# Patient Record
Sex: Female | Born: 1965 | Race: White | Hispanic: No | Marital: Married | Smoking: Former smoker
Health system: Southern US, Community
[De-identification: ages and names within clinical notes are randomized; demographics above are authoritative.]

## PROBLEM LIST (undated history)

## (undated) DIAGNOSIS — E8940 Asymptomatic postprocedural ovarian failure: Secondary | ICD-10-CM

## (undated) DIAGNOSIS — K529 Noninfective gastroenteritis and colitis, unspecified: Secondary | ICD-10-CM

## (undated) DIAGNOSIS — R638 Other symptoms and signs concerning food and fluid intake: Secondary | ICD-10-CM

## (undated) DIAGNOSIS — E785 Hyperlipidemia, unspecified: Secondary | ICD-10-CM

## (undated) DIAGNOSIS — R319 Hematuria, unspecified: Secondary | ICD-10-CM

## (undated) DIAGNOSIS — E669 Obesity, unspecified: Secondary | ICD-10-CM

## (undated) DIAGNOSIS — K5792 Diverticulitis of intestine, part unspecified, without perforation or abscess without bleeding: Secondary | ICD-10-CM

## (undated) DIAGNOSIS — F419 Anxiety disorder, unspecified: Secondary | ICD-10-CM

## (undated) DIAGNOSIS — F32A Depression, unspecified: Secondary | ICD-10-CM

## (undated) DIAGNOSIS — G4730 Sleep apnea, unspecified: Secondary | ICD-10-CM

## (undated) DIAGNOSIS — R300 Dysuria: Secondary | ICD-10-CM

## (undated) HISTORY — PX: HYSTEROSCOPY: SHX211

## (undated) HISTORY — DX: Hematuria, unspecified: R31.9

## (undated) HISTORY — DX: Hyperlipidemia, unspecified: E78.5

## (undated) HISTORY — PX: CHOLECYSTECTOMY: SHX55

## (undated) HISTORY — DX: Anxiety disorder, unspecified: F41.9

## (undated) HISTORY — DX: Dysuria: R30.0

## (undated) HISTORY — DX: Diverticulitis of intestine, part unspecified, without perforation or abscess without bleeding: K57.92

## (undated) HISTORY — DX: Noninfective gastroenteritis and colitis, unspecified: K52.9

## (undated) HISTORY — DX: Obesity, unspecified: E66.9

## (undated) HISTORY — DX: Sleep apnea, unspecified: G47.30

## (undated) HISTORY — PX: SMALL INTESTINE SURGERY: SHX150

## (undated) HISTORY — DX: Depression, unspecified: F32.A

## (undated) HISTORY — PX: COLON SURGERY: SHX602

## (undated) HISTORY — DX: Asymptomatic postprocedural ovarian failure: E89.40

## (undated) HISTORY — DX: Other symptoms and signs concerning food and fluid intake: R63.8

## (undated) HISTORY — PX: DILATION AND CURETTAGE OF UTERUS: SHX78

## (undated) HISTORY — PX: COSMETIC SURGERY: SHX468

## (undated) HISTORY — PX: TUBAL LIGATION: SHX77

## (undated) HISTORY — PX: HYSTEROSCOPY WITH D & C: SHX1775

---

## 1992-05-02 DIAGNOSIS — O24419 Gestational diabetes mellitus in pregnancy, unspecified control: Secondary | ICD-10-CM

## 2002-04-23 HISTORY — PX: HERNIA REPAIR: SHX51

## 2003-04-24 HISTORY — PX: OVARIAN CYST REMOVAL: SHX89

## 2005-02-06 ENCOUNTER — Ambulatory Visit: Payer: Self-pay | Admitting: Obstetrics and Gynecology

## 2005-02-14 ENCOUNTER — Ambulatory Visit: Payer: Self-pay | Admitting: Obstetrics and Gynecology

## 2006-06-27 ENCOUNTER — Ambulatory Visit: Payer: Self-pay | Admitting: Obstetrics and Gynecology

## 2007-05-08 ENCOUNTER — Ambulatory Visit: Payer: Self-pay | Admitting: Family Medicine

## 2007-08-14 ENCOUNTER — Ambulatory Visit: Payer: Self-pay | Admitting: Obstetrics and Gynecology

## 2008-05-04 ENCOUNTER — Ambulatory Visit: Payer: Self-pay | Admitting: Family Medicine

## 2010-09-15 ENCOUNTER — Ambulatory Visit: Payer: Self-pay | Admitting: Internal Medicine

## 2010-10-12 ENCOUNTER — Ambulatory Visit: Payer: Self-pay | Admitting: Obstetrics and Gynecology

## 2010-10-23 ENCOUNTER — Ambulatory Visit: Payer: Self-pay | Admitting: Obstetrics and Gynecology

## 2011-01-02 ENCOUNTER — Ambulatory Visit: Payer: Self-pay | Admitting: Internal Medicine

## 2011-03-12 ENCOUNTER — Ambulatory Visit: Payer: Self-pay | Admitting: Unknown Physician Specialty

## 2012-03-06 ENCOUNTER — Ambulatory Visit: Payer: Self-pay | Admitting: Obstetrics and Gynecology

## 2012-03-06 LAB — CBC
HCT: 41.4 % (ref 35.0–47.0)
HGB: 13.9 g/dL (ref 12.0–16.0)
MCH: 30.2 pg (ref 26.0–34.0)
MCHC: 33.5 g/dL (ref 32.0–36.0)
MCV: 90 fL (ref 80–100)
RDW: 13.2 % (ref 11.5–14.5)

## 2012-03-06 LAB — BASIC METABOLIC PANEL
Anion Gap: 4 — ABNORMAL LOW (ref 7–16)
Calcium, Total: 9 mg/dL (ref 8.5–10.1)
Chloride: 108 mmol/L — ABNORMAL HIGH (ref 98–107)
Co2: 27 mmol/L (ref 21–32)
Osmolality: 276 (ref 275–301)
Potassium: 4.4 mmol/L (ref 3.5–5.1)

## 2012-03-10 ENCOUNTER — Ambulatory Visit: Payer: Self-pay | Admitting: Obstetrics and Gynecology

## 2012-09-29 ENCOUNTER — Other Ambulatory Visit: Payer: Self-pay | Admitting: Unknown Physician Specialty

## 2012-09-29 ENCOUNTER — Ambulatory Visit: Payer: Self-pay | Admitting: Family Medicine

## 2012-09-29 LAB — CLOSTRIDIUM DIFFICILE BY PCR

## 2012-10-03 LAB — STOOL CULTURE

## 2012-10-13 ENCOUNTER — Other Ambulatory Visit: Payer: Self-pay | Admitting: Unknown Physician Specialty

## 2012-10-13 DIAGNOSIS — R933 Abnormal findings on diagnostic imaging of other parts of digestive tract: Secondary | ICD-10-CM

## 2012-10-13 DIAGNOSIS — R112 Nausea with vomiting, unspecified: Secondary | ICD-10-CM

## 2012-10-13 DIAGNOSIS — R197 Diarrhea, unspecified: Secondary | ICD-10-CM

## 2012-10-28 ENCOUNTER — Ambulatory Visit
Admission: RE | Admit: 2012-10-28 | Discharge: 2012-10-28 | Disposition: A | Discharge status: Home / Self Care | Payer: BC Managed Care – PPO | Source: Ambulatory Visit | Attending: Unknown Physician Specialty | Admitting: Unknown Physician Specialty

## 2012-10-28 ENCOUNTER — Other Ambulatory Visit: Payer: Self-pay

## 2012-10-28 DIAGNOSIS — R197 Diarrhea, unspecified: Secondary | ICD-10-CM

## 2012-10-28 DIAGNOSIS — R112 Nausea with vomiting, unspecified: Secondary | ICD-10-CM

## 2012-10-28 DIAGNOSIS — R933 Abnormal findings on diagnostic imaging of other parts of digestive tract: Secondary | ICD-10-CM

## 2012-10-31 ENCOUNTER — Ambulatory Visit
Admission: RE | Admit: 2012-10-31 | Discharge: 2012-10-31 | Disposition: A | Discharge status: Home / Self Care | Payer: BC Managed Care – PPO | Source: Ambulatory Visit | Attending: Unknown Physician Specialty | Admitting: Unknown Physician Specialty

## 2012-10-31 DIAGNOSIS — R197 Diarrhea, unspecified: Secondary | ICD-10-CM

## 2012-10-31 DIAGNOSIS — R112 Nausea with vomiting, unspecified: Secondary | ICD-10-CM

## 2012-10-31 DIAGNOSIS — R933 Abnormal findings on diagnostic imaging of other parts of digestive tract: Secondary | ICD-10-CM

## 2012-10-31 MED ORDER — IOHEXOL 300 MG/ML  SOLN
125.0000 mL | Freq: Once | INTRAMUSCULAR | Status: AC | PRN
  Administered 2012-10-31: 125 mL via INTRAVENOUS

## 2012-11-10 ENCOUNTER — Other Ambulatory Visit: Payer: Self-pay

## 2012-12-15 IMAGING — CT CT ABD-PELV W/ CM
1 of 2 series · 14 of 32 positions shown, 18 images · non-contrast
Comparison: none

REASON FOR EXAM: STAT CR [id number] abdominal and pelvic discomfort
nausea vomiting intermi...
COMMENTS:

[Series 3: soft tissue · axial · 0.77mm/px · z∈[-464,-2]mm · 14 of 175 slices shown, 18 images]
[im 14/175  soft-tissue]
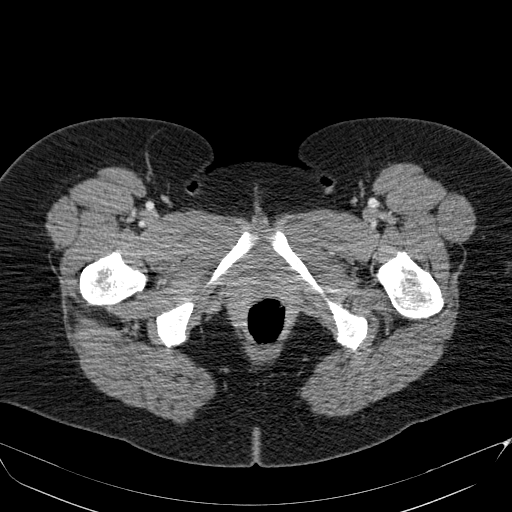
[im 14/175  bone]
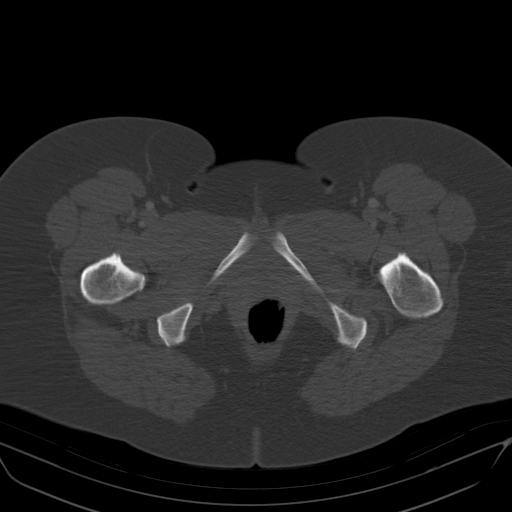
[im 27/175  soft-tissue]
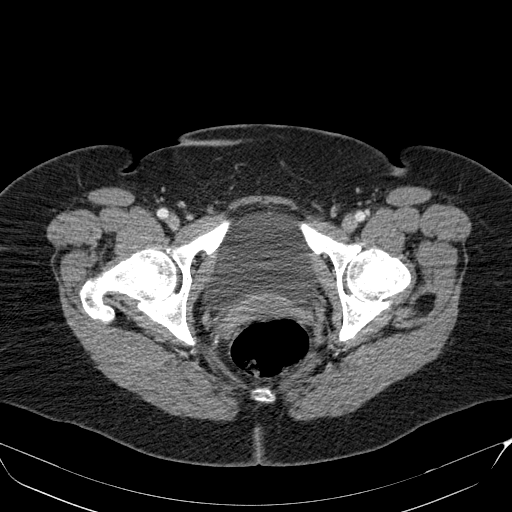
[im 41/175  soft-tissue]
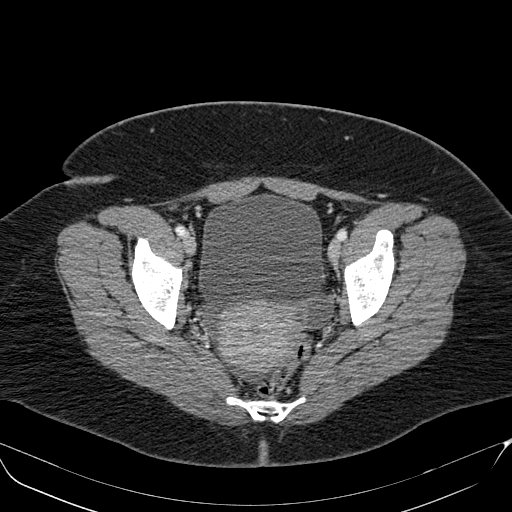
[im 54/175  soft-tissue]
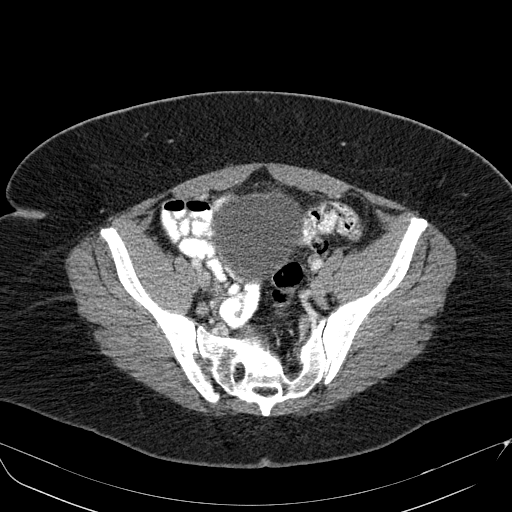
[im 67/175  soft-tissue]
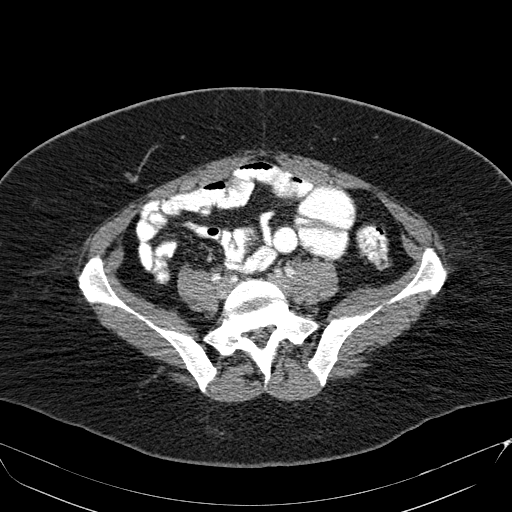
[im 81/175  soft-tissue]
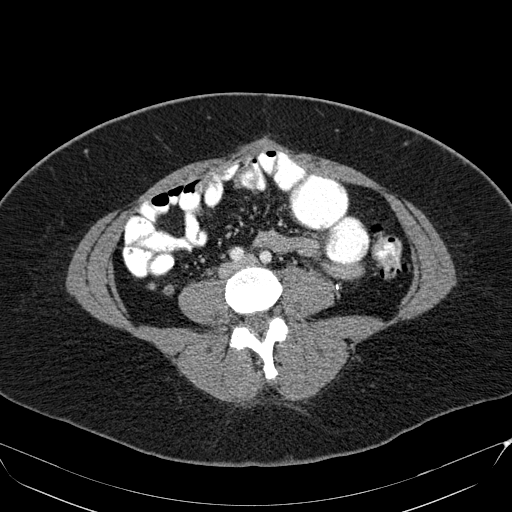
[im 94/175  soft-tissue]
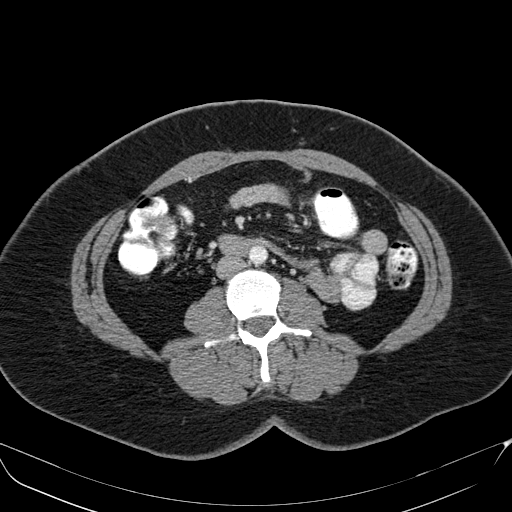
[im 108/175  soft-tissue]
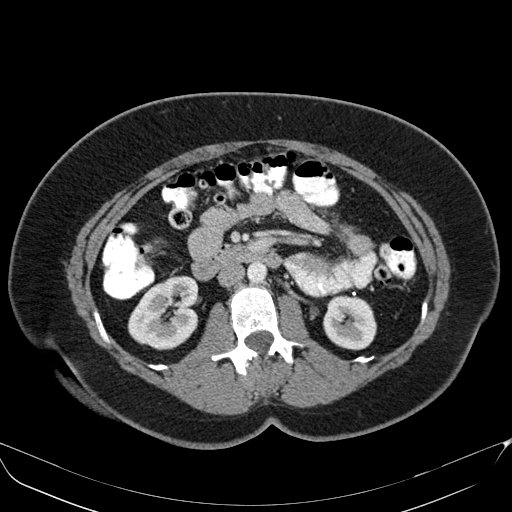
[im 121/175  soft-tissue]
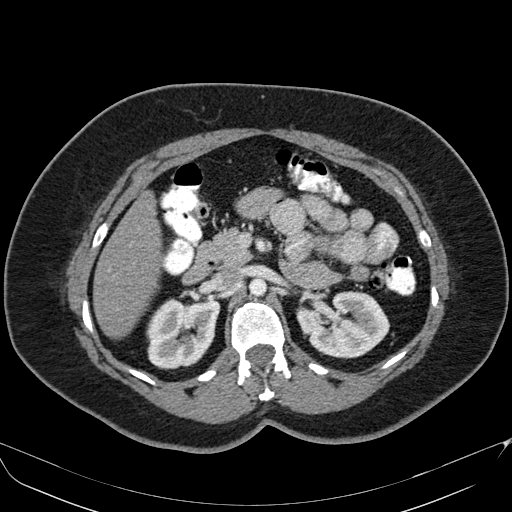
[im 121/175  bone]
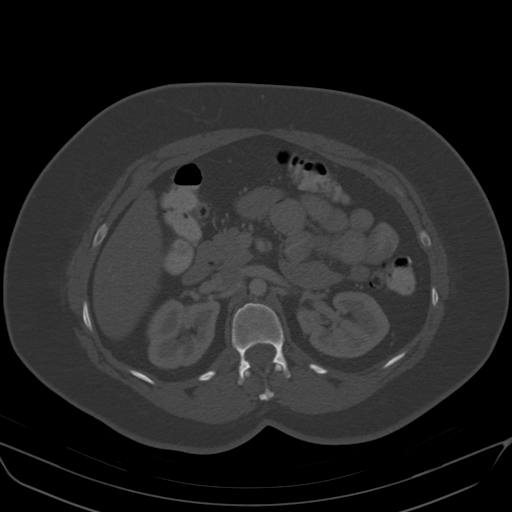
[im 134/175  soft-tissue]
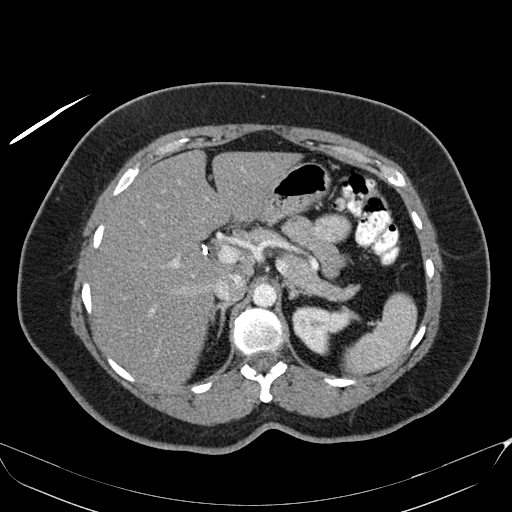
[im 148/175  soft-tissue]
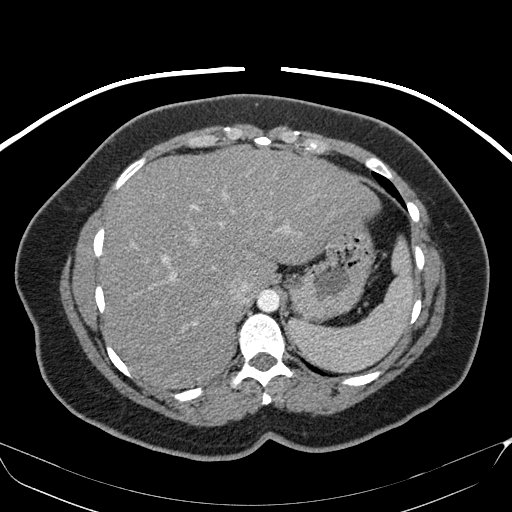
[im 148/175  lung]
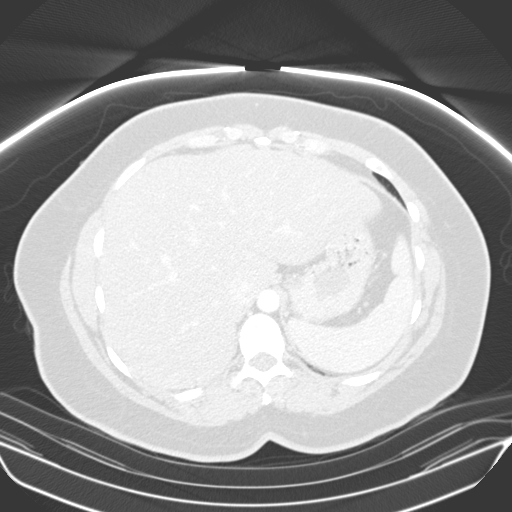
[im 154/175  lung]
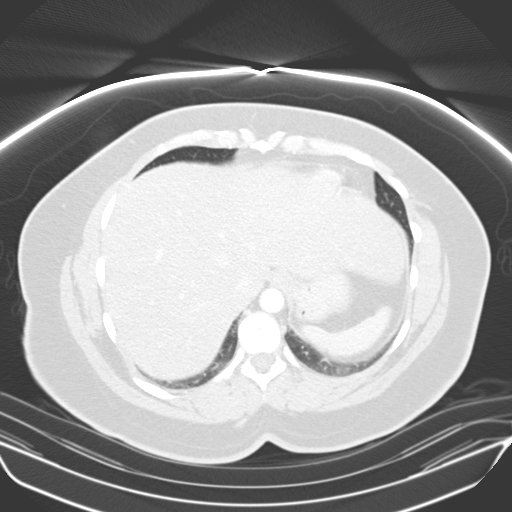
[im 161/175  soft-tissue]
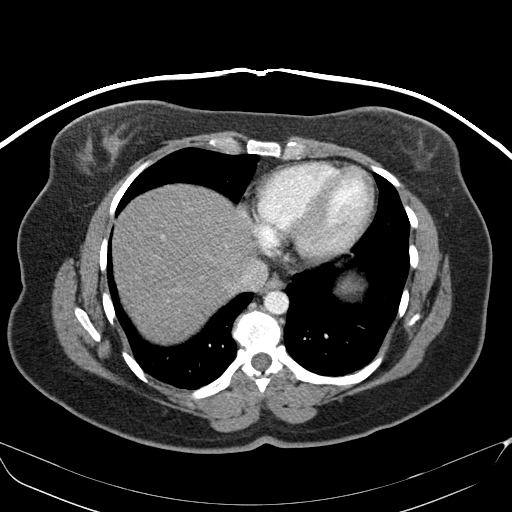
[im 161/175  lung]
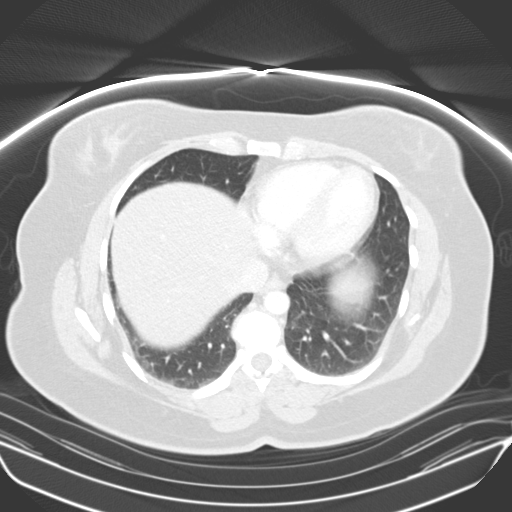
[im 168/175  lung]
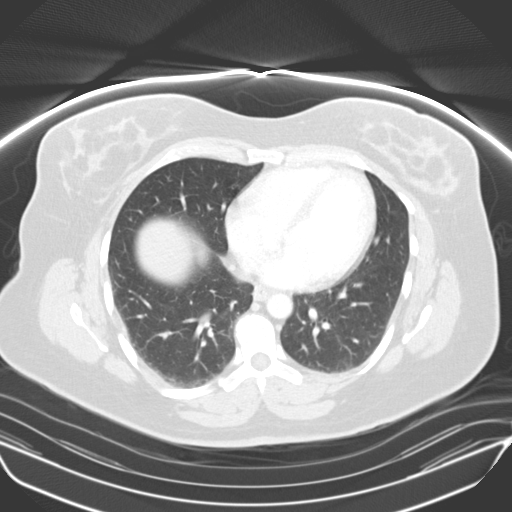

[14 of 32 positions shown; findings below may reference images not displayed]

PROCEDURE:     STAMBERG - STAMBERG ABDOMEN / PELVIS W  - September 15, 2010  [DATE]

RESULT:     Axial CT scanning was performed through the abdomen and pelvis
at 3 mm intervals and slice thicknesses following intravenous administration
of 100 cc of Isovue 370. Review of multiplanar reconstructed images was
performed separately on the VIA monitor.

The liver exhibits decreased density consistent with fatty infiltration.
There is no focal mass or ductal dilation. The gallbladder surgically
absent. The pancreas exhibits no evidence of acute inflammation or masses.
The spleen, partially distended stomach, adrenal glands and kidneys are
normal in appearance. The caliber of the abdominal aorta is normal.

There is a shallow periumbilical hernia against which a loop of the
transverse colon is closely applied. This is not a new finding incarceration
is not seen. There is evidence of previous partial left colectomy. There is
sigmoid diverticulosis without objective evidence of acute diverticulitis. A
normal-appearing appendix is demonstrated. There is no evidence of bowel
obstruction.

Within the pelvis the partially distended urinary bladder is grossly normal.
The uterus is normal in density and contour. It is retroverted. There are
hypodensities in the adnexal regions most conspicuously on the left. This is
seen on image 136 and likely reflects a cystic ovarian process measuring
approximately 3.4 centimeters AP x 2.5 cm transversely. On the right there
is a smaller structure measuring approximately 1.8 cm in diameter. There is
a small amount of free fluid in the pelvis. I see no inguinal hernia.

The lung bases are clear. The lumbar vertebral bodies are preserved in
height.
IMPRESSION: 1. I do not see evidence of acute diverticulitis nor other acute bowel
abnormality.
2. There are hypodensities in the pelvis predominantly on the left which may
reflect cystic ovarian processes. There is a small amount of free fluid
pelvis as well. No acute abnormality of the uterus is seen. Pelvic
ultrasound may be useful for further evaluation.
3. I see no acute urinary tract abnormality nor acute hepatobiliary
abnormality.

This report was called by me to Dr. Miele at [DATE] p.m. on  15 September, 2010.

## 2013-01-29 ENCOUNTER — Ambulatory Visit: Payer: Self-pay

## 2013-02-20 ENCOUNTER — Ambulatory Visit: Payer: Self-pay

## 2013-02-21 HISTORY — PX: VAGINAL HYSTERECTOMY: SUR661

## 2013-03-03 ENCOUNTER — Ambulatory Visit: Payer: Self-pay | Admitting: Obstetrics and Gynecology

## 2013-03-03 LAB — BASIC METABOLIC PANEL
Anion Gap: 2 — ABNORMAL LOW (ref 7–16)
BUN: 16 mg/dL (ref 7–18)
Chloride: 104 mmol/L (ref 98–107)
Creatinine: 0.87 mg/dL (ref 0.60–1.30)
EGFR (African American): >60
EGFR (Non-African Amer.): >60
Glucose: 93 mg/dL (ref 65–99)
Osmolality: 275 (ref 275–301)
Potassium: 4.4 mmol/L (ref 3.5–5.1)
Sodium: 137 mmol/L (ref 136–145)

## 2013-03-03 LAB — CBC
MCH: 30.6 pg (ref 26.0–34.0)
MCV: 90 fL (ref 80–100)
Platelet: 259 10*3/uL (ref 150–440)
RDW: 13.1 % (ref 11.5–14.5)
WBC: 6.7 10*3/uL (ref 3.6–11.0)

## 2013-03-09 ENCOUNTER — Inpatient Hospital Stay: Payer: Self-pay | Admitting: Obstetrics and Gynecology

## 2013-03-09 LAB — CBC WITH DIFFERENTIAL/PLATELET
Eosinophil %: 0 %
HCT: 30.6 % — ABNORMAL LOW (ref 35.0–47.0)
HGB: 10.4 g/dL — ABNORMAL LOW (ref 12.0–16.0)
Lymphocyte #: 0.8 10*3/uL — ABNORMAL LOW (ref 1.0–3.6)
Lymphocyte %: 8.8 %
MCH: 30.9 pg (ref 26.0–34.0)
MCV: 91 fL (ref 80–100)
Monocyte #: 0.3 x10 3/mm (ref 0.2–0.9)
Monocyte %: 3.8 %
Platelet: 251 10*3/uL (ref 150–440)
RBC: 3.37 10*6/uL — ABNORMAL LOW (ref 3.80–5.20)
RDW: 12.9 % (ref 11.5–14.5)
WBC: 9.1 10*3/uL (ref 3.6–11.0)

## 2013-03-10 LAB — CBC WITH DIFFERENTIAL/PLATELET
Basophil #: 0.1 10*3/uL (ref 0.0–0.1)
Basophil %: 0.9 %
Eosinophil #: 0 10*3/uL (ref 0.0–0.7)
Lymphocyte #: 2.8 10*3/uL (ref 1.0–3.6)
MCHC: 34 g/dL (ref 32.0–36.0)
MCV: 90 fL (ref 80–100)
Monocyte #: 1.1 x10 3/mm — ABNORMAL HIGH (ref 0.2–0.9)
Monocyte %: 9.3 %
Neutrophil #: 8 10*3/uL — ABNORMAL HIGH (ref 1.4–6.5)
WBC: 12.1 10*3/uL — ABNORMAL HIGH (ref 3.6–11.0)

## 2013-03-10 LAB — HEMOGLOBIN: HGB: 8.4 g/dL — ABNORMAL LOW (ref 12.0–16.0)

## 2013-03-10 LAB — CREATININE, SERUM
Creatinine: 1.13 mg/dL (ref 0.60–1.30)
EGFR (African American): >60

## 2013-03-11 LAB — CBC WITH DIFFERENTIAL/PLATELET
Basophil #: 0 10*3/uL (ref 0.0–0.1)
Basophil #: 0 10*3/uL (ref 0.0–0.1)
Basophil %: 0.5 %
Eosinophil #: 0.2 10*3/uL (ref 0.0–0.7)
Eosinophil %: 1.6 %
HCT: 18.3 % — ABNORMAL LOW (ref 35.0–47.0)
HCT: 21.6 % — ABNORMAL LOW (ref 35.0–47.0)
HGB: 6.4 g/dL — ABNORMAL LOW (ref 12.0–16.0)
HGB: 7.1 g/dL — ABNORMAL LOW (ref 12.0–16.0)
Lymphocyte #: 3.3 10*3/uL (ref 1.0–3.6)
Lymphocyte %: 37.1 %
MCHC: 34.8 g/dL (ref 32.0–36.0)
MCHC: 33.1 g/dL (ref 32.0–36.0)
MCV: 90 fL (ref 80–100)
Monocyte #: 0.6 x10 3/mm (ref 0.2–0.9)
Monocyte %: 8.2 %
Monocyte %: 6.5 %
Neutrophil #: 4.8 10*3/uL (ref 1.4–6.5)
Platelet: 167 10*3/uL (ref 150–440)
Platelet: 190 10*3/uL (ref 150–440)
RBC: 2.04 10*6/uL — ABNORMAL LOW (ref 3.80–5.20)
RDW: 13.1 % (ref 11.5–14.5)
RDW: 13 % (ref 11.5–14.5)
WBC: 8.9 10*3/uL (ref 3.6–11.0)

## 2013-03-11 LAB — BASIC METABOLIC PANEL
Anion Gap: 3 — ABNORMAL LOW (ref 7–16)
Co2: 31 mmol/L (ref 21–32)
EGFR (Non-African Amer.): >60
Glucose: 96 mg/dL (ref 65–99)
Potassium: 4 mmol/L (ref 3.5–5.1)
Sodium: 141 mmol/L (ref 136–145)

## 2013-03-11 LAB — PATHOLOGY REPORT

## 2013-03-11 LAB — HEMOGLOBIN: HGB: 8.7 g/dL — ABNORMAL LOW (ref 12.0–16.0)

## 2013-03-12 LAB — CBC WITH DIFFERENTIAL/PLATELET
Basophil #: 0 10*3/uL (ref 0.0–0.1)
Eosinophil #: 0.3 10*3/uL (ref 0.0–0.7)
Lymphocyte #: 2.7 10*3/uL (ref 1.0–3.6)
Lymphocyte %: 29.8 %
Monocyte #: 0.6 x10 3/mm (ref 0.2–0.9)
Neutrophil #: 4.3 10*3/uL (ref 1.4–6.5)
Neutrophil %: 59.9 %
RBC: 2.93 10*6/uL — ABNORMAL LOW (ref 3.80–5.20)
RBC: 3.2 10*6/uL — ABNORMAL LOW (ref 3.80–5.20)
WBC: 8.3 10*3/uL (ref 3.6–11.0)
WBC: 8.9 10*3/uL (ref 3.6–11.0)

## 2014-02-09 ENCOUNTER — Ambulatory Visit: Payer: Self-pay | Admitting: Obstetrics and Gynecology

## 2014-02-10 LAB — HM MAMMOGRAPHY

## 2014-08-10 NOTE — Op Note (Signed)
PATIENT NAME:  Joanna Fields, Joanna Fields MR#:  [id number] DATE OF BIRTH:  1965-11-11  DATE OF PROCEDURE:  03/10/2012  PREOPERATIVE DIAGNOSES:  1. Postmenopausal bleeding. 2. Endometrial polyps.  POSTOPERATIVE DIAGNOSES:  1. Postmenopausal bleeding. 2. Endometrial polyps. 3. Uterine prolapse.   SURGICAL PROCEDURES:  1. Hysteroscopy. 2. D and C.   SURGEON: Martin A. DeFrancesco, MD   FIRST ASSISTANT: None.   ANESTHESIA: General.   INDICATIONS: The patient is a 49 year old white female who presents for surgical management of postmenopausal bleeding. The patient had a preoperative ultrasound that demonstrated probable endometrial polyps.   FINDINGS AT SURGERY: Gynecoid bony pelvis. The uterus was normal size in the mid to retroverted position. There was moderate uterine descensus with the cervix coming to within 2 cm of the introitus. The uterus sounded to 9 cm. On hysteroscopy there were a few small endometrial polyps present, otherwise unremarkable endometrial cavity.   DESCRIPTION OF PROCEDURE: The patient was brought to the operating room where she was placed in the supine position. General anesthesia was induced without difficulty. She was placed in the dorsal lithotomy position using the candy-cane stirrups. A Betadine perineal intravaginal prep and drape was performed in the standard fashion. A red Robinson catheter was used to drain 300 mL of urine from the bladder. A weighted speculum was placed in the vagina and a single-tooth tenaculum was placed on the anterior lip of the cervix. The uterus sounded to 9 cm. The endocervical canal was then dilated using Hanks dilators up to a #20 [place] caliber. The [facility] hysteroscope with lactated Ringer's as irrigant was used to assess the intrauterine cavity. The above-noted findings were photodocumented. The endometrium was then curetted with smooth and serrated curettes. Stone polyp forceps were used to extract the residual tissue and polyps. Repeat  hysteroscopy revealed no significant tissue left behind. The procedure was then terminated with all instrumentation being removed from the vagina. The patient was then awakened, mobilized, and taken to the recovery room in satisfactory condition. Estimated blood loss was less than 25 mL. Complications were none. All instruments, needles, and sponge counts were verified as correct. The patient is a good candidate for vaginal hysterectomy if hysterectomy is indicated in the future.   ____________________________ Prentice DockerMartin A. DeFrancesco, MD mad:drc D: 03/10/2012 13:18:12 ET T: 03/10/2012 13:46:06 ET JOB#: [id number]  cc: Daphine DeutscherMartin A. DeFrancesco, MD, <Dictator> [facility] Prentice DockerMARTIN A DEFRANCESCO MD ELECTRONICALLY SIGNED 03/17/2012 12:36

## 2014-08-13 NOTE — Op Note (Signed)
PATIENT NAME:  Joanna Fields, Joanna Fields MR#:  [id number] DATE OF BIRTH:  November 02, 1965  DATE OF PROCEDURE:  03/09/2013  PREOPERATIVE DIAGNOSIS:  Menorrhagia.   POSTOPERATIVE DIAGNOSIS:  Menorrhagia.  OPERATIVE PROCEDURE: TVH, bilateral salpingo-oophorectomy.   SURGEON: Herold HarmsMartin A DeFrancesco, MD   FIRST ASSISTANT: Jacques EarthlyAnika S. Valentino Saxonherry, MD, and Daleen BoSarah Dennin, PA-S.   ANESTHESIA: General endotracheal.   INDICATIONS: The patient is a 49 year old white female with chronic menorrhagia that has been refractory to conservative medical and surgical therapy. She has known endometrial polyps from hysteroscopy, D and C previously. She continues to bleed abnormally and has had pelvic pain in the form of dyspareunia. She is desiring to proceed with definitive surgery.   FINDINGS AT SURGERY: Revealed a top-normal size uterus. There was mild uterine prolapse present. There was a second-degree cystocele present as well. The tubes and ovaries were grossly normal.   DESCRIPTION OF PROCEDURE: The patient was brought to the Operating Room where she was placed in the supine position. General endotracheal anesthesia was induced with difficulty. She was placed in dorsal lithotomy position using the candy-cane stirrups. A Betadine perineal and intravaginal prep and drape was performed in standard fashion. A Foley catheter was placed and was draining clear yellow urine from the bladder.   A weighted speculum was placed into the vagina and a double-tooth tenaculum was placed on the cervix. The posterior colpotomy was made with Mayo scissors. Uterosacral ligaments were clamped, cut and stick tied using 0 Vicryl suture. The cervix was circumscribed using Bovie cautery and the bladder was dissected off the lower uterine segment through sharp and blunt dissection. Sequentially the cardinal and broad ligament complexes were then clamped, cut and stick tied using 0 Vicryl suture. This was carried out to the level of the utero-ovarian ligaments which  were then crossclamped, cut and the specimen was removed from the operative field. The fallopian tubes were then isolated and crossclamped with curved Heaney clamps. These were excised bilaterally. Stick ties were used to place for hemostasis. A moderate amount of oozing was noted to come from each pedicle and the decision was made to remove the ovaries bilaterally. The infundibulopelvic ligament ligaments were clamped and cut using 0 Vicryl suture to ligate the pedicles. Good hemostasis was obtained. This was done bilaterally.   After checking for adequate levels of hemostasis, the vagina was then closed. First the peritoneum was reapproximated using a 0 Vicryl suture in a pursestring stitch. The vaginal mucosa was then closed using simple interrupted sutures of 2-0 chromic. Upon completion of the procedure, all instrumentation was removed from the vagina. The patient was then awakened, mobilized and taken to the recovery room in satisfactory condition.   ESTIMATED BLOOD LOSS: 250 mL.    ____________________________ Prentice DockerMartin A. DeFrancesco, MD mad:cs D: 03/09/2013 19:22:31 ET T: 03/09/2013 19:57:29 ET JOB#: [id number]  cc: Daphine DeutscherMartin A. DeFrancesco, MD, <Dictator> [facility] Prentice DockerMARTIN A DEFRANCESCO MD ELECTRONICALLY SIGNED 03/10/2013 22:04

## 2014-10-01 ENCOUNTER — Telehealth: Payer: Self-pay | Admitting: Gastroenterology

## 2014-10-01 NOTE — Telephone Encounter (Signed)
triage

## 2014-10-12 ENCOUNTER — Other Ambulatory Visit: Payer: Self-pay

## 2014-10-12 NOTE — Telephone Encounter (Signed)
LVM for pt to return my call.

## 2015-02-17 ENCOUNTER — Other Ambulatory Visit: Payer: Self-pay | Admitting: Obstetrics and Gynecology

## 2015-02-17 DIAGNOSIS — Z1231 Encounter for screening mammogram for malignant neoplasm of breast: Secondary | ICD-10-CM

## 2015-02-22 ENCOUNTER — Ambulatory Visit
Admission: RE | Admit: 2015-02-22 | Discharge: 2015-02-22 | Disposition: A | Discharge status: Home / Self Care | Payer: BLUE CROSS/BLUE SHIELD | Source: Ambulatory Visit | Attending: Obstetrics and Gynecology | Admitting: Obstetrics and Gynecology

## 2015-02-22 DIAGNOSIS — Z1231 Encounter for screening mammogram for malignant neoplasm of breast: Secondary | ICD-10-CM | POA: Diagnosis present

## 2015-03-10 ENCOUNTER — Ambulatory Visit (INDEPENDENT_AMBULATORY_CARE_PROVIDER_SITE_OTHER): Payer: BLUE CROSS/BLUE SHIELD | Admitting: Obstetrics and Gynecology

## 2015-03-10 ENCOUNTER — Encounter: Payer: Self-pay | Admitting: Obstetrics and Gynecology

## 2015-03-10 VITALS — BP 121/75 | HR 96 | Ht 69.0 in | Wt 220.3 lb

## 2015-03-10 DIAGNOSIS — R638 Other symptoms and signs concerning food and fluid intake: Secondary | ICD-10-CM | POA: Diagnosis not present

## 2015-03-10 DIAGNOSIS — Z8659 Personal history of other mental and behavioral disorders: Secondary | ICD-10-CM

## 2015-03-10 DIAGNOSIS — E785 Hyperlipidemia, unspecified: Secondary | ICD-10-CM

## 2015-03-10 DIAGNOSIS — N958 Other specified menopausal and perimenopausal disorders: Secondary | ICD-10-CM | POA: Diagnosis not present

## 2015-03-10 DIAGNOSIS — E8940 Asymptomatic postprocedural ovarian failure: Secondary | ICD-10-CM | POA: Insufficient documentation

## 2015-03-10 DIAGNOSIS — K5790 Diverticulosis of intestine, part unspecified, without perforation or abscess without bleeding: Secondary | ICD-10-CM

## 2015-03-10 NOTE — Progress Notes (Signed)
Patient ID: Joanna Fields, female   DOB: 10/10/1965, 49 y.o.   MRN: [id number] 6 month f/u on zoloft and hrt  pt d/c both meds 6 weeks ago- did not help.  started going to gym and losing weight - feels better- lost 27 pounds in 6 months  no meds desired at this time  Chief complaint: 1. Surgical menopause 2. Obesity 3. Anxiety  Patient presents for follow-up issues. She has changed her lifestyle significantly. She is exercising and has had a 27 pound weight loss over the past 6 months. She feels more energized and healthy. She has discontinued her estradiol and her Zoloft. She is experiencing only occasional vasomotor symptoms which are not affecting her quality of life. Anxiety symptoms are not present.  The pros and cons of continued estrogen replacement therapy in a woman who is surgically menopausal were reviewed. Benefits include resolution of vasomotor symptoms, vaginal dryness, and help with prevention of osteoporosis. She is continuing to take calcium with vitamin D and doing weightbearing exercise.  Following discussion and answering of questions regarding ERT, patient has opted to continue with estrogen replacement at this time. All risks are accepted.  IMPRESSION: 1. Surgical menopause, minimally symptomatic 2. Obesity, with 27 pound weight loss through healthy eating and exercise 3. Anxiety, resolved off medication  PLAN: 1. Patient will continue to eat healthy and exercise with goal of losing more weight. 2. She will remain off of Zoloft. 3. Patient will restart estradiol for estrogen replacement therapy benefits.  A total of 25 minutes were spent face-to-face with the patient during this encounter and over half of that time involved counseling and coordination of care.  Herold HarmsMartin A Defrancesco, MD  Note: This dictation was prepared with Dragon dictation along with smaller phrase technology. Any transcriptional errors that result from this process are unintentional.

## 2015-03-10 NOTE — Patient Instructions (Signed)
1. Continue with estrogen replacement therapy for bone health benefits. 2. We discussed the pros and cons of continuing estrogen replacement therapy; vasomotor symptom control, healthy bone contribution, and potential implications for cardiovascular disease development were reviewed. 3. Continue with healthy eating and exercise with ongoing weight loss 4. Return in May 2017 for annual exam

## 2015-09-15 ENCOUNTER — Encounter: Payer: Self-pay | Admitting: Obstetrics and Gynecology

## 2015-10-07 ENCOUNTER — Other Ambulatory Visit: Payer: Self-pay | Admitting: Obstetrics and Gynecology

## 2015-10-12 ENCOUNTER — Encounter: Payer: Self-pay | Admitting: Obstetrics and Gynecology

## 2015-10-12 ENCOUNTER — Ambulatory Visit (INDEPENDENT_AMBULATORY_CARE_PROVIDER_SITE_OTHER): Payer: BLUE CROSS/BLUE SHIELD | Admitting: Obstetrics and Gynecology

## 2015-10-12 VITALS — BP 125/80 | HR 99 | Ht 69.0 in | Wt 204.1 lb

## 2015-10-12 DIAGNOSIS — Z1211 Encounter for screening for malignant neoplasm of colon: Secondary | ICD-10-CM | POA: Diagnosis not present

## 2015-10-12 DIAGNOSIS — R638 Other symptoms and signs concerning food and fluid intake: Secondary | ICD-10-CM | POA: Diagnosis not present

## 2015-10-12 DIAGNOSIS — N958 Other specified menopausal and perimenopausal disorders: Secondary | ICD-10-CM

## 2015-10-12 DIAGNOSIS — Z01419 Encounter for gynecological examination (general) (routine) without abnormal findings: Secondary | ICD-10-CM

## 2015-10-12 DIAGNOSIS — E8940 Asymptomatic postprocedural ovarian failure: Secondary | ICD-10-CM

## 2015-10-12 MED ORDER — ESTRADIOL 0.1 MG/24HR TD PTTW: 1.0000 | MEDICATED_PATCH | TRANSDERMAL | Status: DC

## 2015-10-12 NOTE — Progress Notes (Signed)
Patient ID: Joanna Fields, female   DOB: 20-Dec-1965, 50 y.o.   MRN: [id number] ANNUAL PREVENTATIVE CARE GYN  ENCOUNTER NOTE  Subjective:       Joanna Fields is a 50 y.o. [phone] female here for a routine annual gynecologic exam.  Current complaints: 1.  Surgical Menopause 2. Increased BMI-BMI of 30 but lost over 40 lbs in the past year   Gynecologic History No LMP recorded. Patient is not currently having periods (Reason: Perimenopausal). Contraception: status post hysterectomy Last Pap: 01/2013 neg/neg. Results were: normal Last mammogram: 2016. Results were: normal  Obstetric History OB History  Gravida Para Term Preterm AB SAB TAB Ectopic Multiple Living  # Outcome Date GA Lbr Len/2nd Weight Sex Delivery Anes PTL Lv  2 Term      Vag-Spont   Y  1 Term      Vag-Spont   Y      Past Medical History  Diagnosis Date  . Diverticulitis   . Obesity   . Colitis   . Increased BMI   . Surgical menopause   . Hyperlipemia   . Dysuria   . Hematuria     Past Surgical History  Procedure Laterality Date  . Vaginal hysterectomy  02/2013    tvh bso  . Hysteroscopy w/d&c    . Tubal ligation    . Cosmetic surgery      on face- dog bite  . Hernia repair  2004    ventral  . Colon surgery    . Ovarian cyst removal Left 2005    Current Outpatient Prescriptions on File Prior to Visit  Medication Sig Dispense Refill  . calcium-vitamin D (OSCAL WITH D) 250-125 MG-UNIT tablet Take 1 tablet by mouth daily.    . Multiple Vitamins-Minerals (MULTIVITAMIN WITH MINERALS) tablet Take 1 tablet by mouth daily.    . Omega-3 Fatty Acids (FISH OIL) 1000 MG CAPS Take by mouth.     No current facility-administered medications on file prior to visit.    Allergies  Allergen Reactions  . Penicillins   . Sulfa Antibiotics     Social History   Social History  . Marital Status: Married    Spouse Name: N/A  . Number of Children: N/A  . Years of Education: N/A    Occupational History  . Not on file.   Social History Main Topics  . Smoking status: Former Games developer  . Smokeless tobacco: Not on file  . Alcohol Use: No  . Drug Use: No  . Sexual Activity: Yes    Birth Control/ Protection: Surgical   Other Topics Concern  . Not on file   Social History Narrative    Family History  Problem Relation Age of Onset  . Ovarian cancer Neg Hx   . Colon cancer Neg Hx   . Diabetes Father   . Breast cancer Maternal Aunt     The following portions of the patient's history were reviewed and updated as appropriate: allergies, current medications, past family history, past medical history, past social history, past surgical history and problem list.  Review of Systems ROS Review of Systems - General ROS: negative for - chills, fatigue, fever, hot flashes, night sweats, weight gain or weight loss Psychological ROS: negative for - anxiety, decreased libido, depression, mood swings, physical abuse or sexual abuse Ophthalmic ROS: negative for - blurry vision, eye pain or loss of vision ENT ROS: negative for -  headaches, hearing change, visual changes or vocal changes Allergy and Immunology ROS: negative for - hives, itchy/watery eyes or seasonal allergies Hematological and Lymphatic ROS: negative for - bleeding problems, bruising, swollen lymph nodes or weight loss Endocrine ROS: negative for - galactorrhea, hair pattern changes, hot flashes, malaise/lethargy, mood swings, palpitations, polydipsia/polyuria, skin changes, temperature intolerance or unexpected weight changes Breast ROS: negative for - new or changing breast lumps or nipple discharge Respiratory ROS: negative for - cough or shortness of breath Cardiovascular ROS: negative for - chest pain, irregular heartbeat, palpitations or shortness of breath Gastrointestinal ROS: no abdominal pain, change in bowel habits, or black or bloody stools Genito-Urinary ROS: no dysuria, trouble voiding, or  hematuria Musculoskeletal ROS: negative for - joint pain or joint stiffness Neurological ROS: negative for - bowel and bladder control changes Dermatological ROS: negative for rash and skin lesion changes   Objective:   BP 125/80 mmHg  Pulse 99  Ht 5\' 9"  (1.753 m)  Wt 204 lb 1.6 oz (92.579 kg)  BMI 30.13 kg/m2 CONSTITUTIONAL: Well-developed, well-nourished female in no acute distress.  PSYCHIATRIC: Normal mood and affect. Normal behavior. Normal judgment and thought content. NEUROLGIC: Alert and oriented to person, place, and time. Normal muscle tone coordination. No cranial nerve deficit noted. HENT:  Normocephalic, atraumatic, External right and left ear normal. Oropharynx is clear and moist EYES: Conjunctivae and EOM are normal. No scleral icterus.  NECK: Normal range of motion, supple, no masses.  Normal thyroid.  SKIN: Skin is warm and dry. No rash noted. Not diaphoretic. No erythema. No pallor. CARDIOVASCULAR: Normal heart rate noted, regular rhythm, no murmur. RESPIRATORY: Clear to auscultation bilaterally. Effort and breath sounds normal, no problems with respiration noted. BREASTS: Symmetric in size. No masses, skin changes, nipple drainage, or lymphadenopathy. ABDOMEN: Soft, normal bowel sounds, no distention noted.  No tenderness, rebound or guarding.  BLADDER: Normal PELVIC:  External Genitalia: Normal  BUS: Normal  Vagina: Normal ; good vault support ; good estrogen effect  Cervix: Surgically absent  Uterus: Surgically absent  Adnexa: Normal, nontender  RV: External Exam NormaI, No Rectal Masses and Normal Sphincter tone  MUSCULOSKELETAL: Normal range of motion. No tenderness.  No cyanosis, clubbing, or edema.  2+ distal pulses. LYMPHATIC: No Axillary, Supraclavicular, or Inguinal Adenopathy.    Assessment:   Annual gynecologic examination 50 y.o. Contraception: status post hysterectomy bmi- 30 Problem List Items Addressed This Visit      Other   Surgical  menopause   Increased BMI    Other Visit Diagnoses    Well woman exam with routine gynecological exam    -  Primary    Relevant Orders    Glucose, random    Hemoglobin A1c    Lipid panel    VITAMIN D 25 Hydroxy (Vit-D Deficiency, Fractures)    TSH    Screening for colon cancer        Relevant Orders    Fecal occult blood, imunochemical       Plan:  Pap: Not needed Mammogram: thru employer Stool Guaiac Testing:  Ordered Labs: lipid vit d fbs a1c tsh Routine preventative health maintenance measures emphasized: Exercise/Diet/Weight control, Tobacco Warnings and Alcohol/Substance use risks 1. Continue using estradiol patch 2. Continue with weight loss. Current goal of 180 lbs; advised to lose 20 lbs over the next two years 3. Given stool cards for colon cancer screening 4. Mammogram due this year Return to Clinic - 1 Year  Mable FillPaige Owczarczak, PA-S Sun[employer]ardCrystal Miller, CMA Liberty MutualMartin  A Donald Memoli, MD    I have seen, interviewed, and examined the patient in conjunction with the [facility] [place].A. student and affirm the diagnosis and management plan. Casmira Cramer A. Delight Bickle, MD, FACOG   Note: This dictation was prepared with Dragon dictation along with smaller phrase technology. Any transcriptional errors that result from this process are unintentional.

## 2015-10-12 NOTE — Patient Instructions (Addendum)
1. No Pap smear needed 2. Mammogram to be obtained in October 3. Stool guaiac cards are given for colon cancer screening 4. Continue with healthy eating and exercise with slow steady weight loss. Excellent results to date 5. Calcium with vitamin D supplementation recommended to help minimize osteoporosis risk 6. Return in 1 year 7. Estrogen patch is refilled for 1 year

## 2015-12-22 ENCOUNTER — Telehealth: Payer: Self-pay | Admitting: *Deleted

## 2015-12-22 NOTE — Telephone Encounter (Signed)
Entered in error calling regarding her daughter, Joanna BumpsJessica

## 2016-02-14 ENCOUNTER — Other Ambulatory Visit: Payer: Self-pay | Admitting: Obstetrics and Gynecology

## 2016-02-14 DIAGNOSIS — Z1231 Encounter for screening mammogram for malignant neoplasm of breast: Secondary | ICD-10-CM

## 2016-03-01 ENCOUNTER — Ambulatory Visit
Admission: RE | Admit: 2016-03-01 | Discharge: 2016-03-01 | Disposition: A | Discharge status: Home / Self Care | Payer: BLUE CROSS/BLUE SHIELD | Source: Ambulatory Visit | Attending: Obstetrics and Gynecology | Admitting: Obstetrics and Gynecology

## 2016-03-01 DIAGNOSIS — Z1231 Encounter for screening mammogram for malignant neoplasm of breast: Secondary | ICD-10-CM | POA: Diagnosis present

## 2016-06-17 ENCOUNTER — Other Ambulatory Visit: Payer: Self-pay | Admitting: Obstetrics and Gynecology

## 2016-10-15 NOTE — Progress Notes (Deleted)
Patient ID: Joanna Fields, female   DOB: 1965/05/17, 51 y.o.   MRN: [id number] ANNUAL PREVENTATIVE CARE GYN  ENCOUNTER NOTE  Subjective:       Joanna Fields is a 51 y.o. [phone] female here for a routine annual gynecologic exam.  Current complaints: 1.  Surgical Menopause    Gynecologic History No LMP recorded. Patient is not currently having periods (Reason: Perimenopausal). Contraception: status post hysterectomy Last Pap: 01/2013 neg/neg. Results were: normal Last mammogram: 02/2016 birad 1. Results were: normal  Obstetric History OB History  Gravida Para Term Preterm AB Living  2 2 2     2   SAB TAB Ectopic Multiple Live Births          2    # Outcome Date GA Lbr Len/2nd Weight Sex Delivery Anes PTL Lv  2 Term      Vag-Spont   LIV  1 Term      Vag-Spont   LIV      Past Medical History:  Diagnosis Date  . Colitis   . Diverticulitis   . Dysuria   . Hematuria   . Hyperlipemia   . Increased BMI   . Obesity   . Surgical menopause     Past Surgical History:  Procedure Laterality Date  . COLON SURGERY    . COSMETIC SURGERY     on face- dog bite  . HERNIA REPAIR  2004   ventral  . HYSTEROSCOPY W/D&C    . OVARIAN CYST REMOVAL Left 2005  . TUBAL LIGATION    . VAGINAL HYSTERECTOMY  02/2013   [facility] bso    Current Outpatient Prescriptions on File Prior to Visit  Medication Sig Dispense Refill  . calcium-vitamin D (OSCAL WITH D) 250-125 MG-UNIT tablet Take 1 tablet by mouth daily.    Marland Kitchen estradiol (VIVELLE-DOT) 0.1 MG/24HR patch APPLY 1 PATCH TWICE A WEEK 8 patch 4  . Multiple Vitamins-Minerals (MULTIVITAMIN WITH MINERALS) tablet Take 1 tablet by mouth daily.    . Omega-3 Fatty Acids (FISH OIL) 1000 MG CAPS Take by mouth.     No current facility-administered medications on file prior to visit.     Allergies  Allergen Reactions  . Penicillins   . Sulfa Antibiotics     Social History   Social History  . Marital status: Married    Spouse name: N/A  . Number of  children: N/A  . Years of education: N/A   Occupational History  . Not on file.   Social History Main Topics  . Smoking status: Former Games developer  . Smokeless tobacco: Not on file  . Alcohol use No  . Drug use: No  . Sexual activity: Yes    Birth control/ protection: Surgical   Other Topics Concern  . Not on file   Social History Narrative  . No narrative on file    Family History  Problem Relation Age of Onset  . Diabetes Father   . Breast cancer Maternal Aunt   . Ovarian cancer Neg Hx   . Colon cancer Neg Hx     The following portions of the patient's history were reviewed and updated as appropriate: allergies, current medications, past family history, past medical history, past social history, past surgical history and problem list.  Review of Systems ROS    Objective:   There were no vitals taken for this visit. CONSTITUTIONAL: Well-developed, well-nourished female in no acute distress.  PSYCHIATRIC: Normal mood and affect. Normal behavior. Normal judgment and thought  content. NEUROLGIC: Alert and oriented to person, place, and time. Normal muscle tone coordination. No cranial nerve deficit noted. HENT:  Normocephalic, atraumatic, External right and left ear normal. Oropharynx is clear and moist EYES: Conjunctivae and EOM are normal. No scleral icterus.  NECK: Normal range of motion, supple, no masses.  Normal thyroid.  SKIN: Skin is warm and dry. No rash noted. Not diaphoretic. No erythema. No pallor. CARDIOVASCULAR: Normal heart rate noted, regular rhythm, no murmur. RESPIRATORY: Clear to auscultation bilaterally. Effort and breath sounds normal, no problems with respiration noted. BREASTS: Symmetric in size. No masses, skin changes, nipple drainage, or lymphadenopathy. ABDOMEN: Soft, normal bowel sounds, no distention noted.  No tenderness, rebound or guarding.  BLADDER: Normal PELVIC:  External Genitalia: Normal  BUS: Normal  Vagina: Normal ; good vault  support ; good estrogen effect  Cervix: Surgically absent  Uterus: Surgically absent  Adnexa: Normal, nontender  RV: External Exam NormaI, No Rectal Masses and Normal Sphincter tone  MUSCULOSKELETAL: Normal range of motion. No tenderness.  No cyanosis, clubbing, or edema.  2+ distal pulses. LYMPHATIC: No Axillary, Supraclavicular, or Inguinal Adenopathy.    Assessment:   Annual gynecologic examination 51 y.o. Contraception: status post hysterectomy bmi- 30 Problem List Items Addressed This Visit    Surgical menopause   Increased BMI   History of anxiety   Diverticulosis   Hyperlipidemia    Other Visit Diagnoses    Well woman exam with routine gynecological exam    -  Primary   Screening for colon cancer          Plan:  Pap: Not needed Mammogram: thru employer Stool Guaiac Testing:  Ordered Labs: lipid vit d fbs a1c tsh Routine preventative health maintenance measures emphasized: Exercise/Diet/Weight control, Tobacco Warnings and Alcohol/Substance use risks 1. Continue using estradiol patch 2. Continue with weight loss. Current goal of 180 lbs; advised to lose 20 lbs over the next two years 3. Given stool cards for colon cancer screening 4. Mammogram due this year Return to Clinic - 1 Year   Fran Mcree [place], CMA    I have seen, interviewed, and examined the patient in conjunction with the [employer] student and affirm the diagnosis and management plan. Martin A. DeFrancesco, MD, FACOG   Note: This dictation was prepared with Dragon dictation along with smaller phrase technology. Any transcriptional errors that result from this process are unintentional.

## 2016-10-17 ENCOUNTER — Encounter: Payer: BLUE CROSS/BLUE SHIELD | Admitting: Obstetrics and Gynecology

## 2016-11-05 NOTE — Progress Notes (Deleted)
Patient ID: Joanna Fields, female   DOB: 1965/05/17, 51 y.o.   MRN: [id number] ANNUAL PREVENTATIVE CARE GYN  ENCOUNTER NOTE  Subjective:       Joanna Fields is a 51 y.o. [phone] female here for a routine annual gynecologic exam.  Current complaints: 1.  Surgical Menopause    Gynecologic History No LMP recorded. Patient is not currently having periods (Reason: Perimenopausal). Contraception: status post hysterectomy Last Pap: 01/2013 neg/neg. Results were: normal Last mammogram: 02/2016 birad 1. Results were: normal  Obstetric History OB History  Gravida Para Term Preterm AB Living  2 2 2     2   SAB TAB Ectopic Multiple Live Births          2    # Outcome Date GA Lbr Len/2nd Weight Sex Delivery Anes PTL Lv  2 Term      Vag-Spont   LIV  1 Term      Vag-Spont   LIV      Past Medical History:  Diagnosis Date  . Colitis   . Diverticulitis   . Dysuria   . Hematuria   . Hyperlipemia   . Increased BMI   . Obesity   . Surgical menopause     Past Surgical History:  Procedure Laterality Date  . COLON SURGERY    . COSMETIC SURGERY     on face- dog bite  . HERNIA REPAIR  2004   ventral  . HYSTEROSCOPY W/D&C    . OVARIAN CYST REMOVAL Left 2005  . TUBAL LIGATION    . VAGINAL HYSTERECTOMY  02/2013   [facility]    Current Outpatient Prescriptions on File Prior to Visit  Medication Sig Dispense Refill  . calcium-vitamin D (OSCAL WITH D) 250-125 MG-UNIT tablet Take 1 tablet by mouth daily.    Marland Kitchen estradiol (VIVELLE-DOT) 0.1 MG/24HR patch APPLY 1 PATCH TWICE A WEEK 8 patch 4  . Multiple Vitamins-Minerals (MULTIVITAMIN WITH MINERALS) tablet Take 1 tablet by mouth daily.    . Omega-3 Fatty Acids (FISH OIL) 1000 MG CAPS Take by mouth.     No current facility-administered medications on file prior to visit.     Allergies  Allergen Reactions  . Penicillins   . Sulfa Antibiotics     Social History   Social History  . Marital status: Married    Spouse name: N/A  . Number of  children: N/A  . Years of education: N/A   Occupational History  . Not on file.   Social History Main Topics  . Smoking status: Former Games developer  . Smokeless tobacco: Not on file  . Alcohol use No  . Drug use: No  . Sexual activity: Yes    Birth control/ protection: Surgical   Other Topics Concern  . Not on file   Social History Narrative  . No narrative on file    Family History  Problem Relation Age of Onset  . Diabetes Father   . Breast cancer Maternal Aunt   . Ovarian cancer Neg Hx   . Colon cancer Neg Hx     The following portions of the patient's history were reviewed and updated as appropriate: allergies, current medications, past family history, past medical history, past social history, past surgical history and problem list.  Review of Systems ROS    Objective:   There were no vitals taken for this visit. CONSTITUTIONAL: Well-developed, well-nourished female in no acute distress.  PSYCHIATRIC: Normal mood and affect. Normal behavior. Normal judgment and thought  content. NEUROLGIC: Alert and oriented to person, place, and time. Normal muscle tone coordination. No cranial nerve deficit noted. HENT:  Normocephalic, atraumatic, External right and left ear normal. Oropharynx is clear and moist EYES: Conjunctivae and EOM are normal. No scleral icterus.  NECK: Normal range of motion, supple, no masses.  Normal thyroid.  SKIN: Skin is warm and dry. No rash noted. Not diaphoretic. No erythema. No pallor. CARDIOVASCULAR: Normal heart rate noted, regular rhythm, no murmur. RESPIRATORY: Clear to auscultation bilaterally. Effort and breath sounds normal, no problems with respiration noted. BREASTS: Symmetric in size. No masses, skin changes, nipple drainage, or lymphadenopathy. ABDOMEN: Soft, normal bowel sounds, no distention noted.  No tenderness, rebound or guarding.  BLADDER: Normal PELVIC:  External Genitalia: Normal  BUS: Normal  Vagina: Normal ; good vault  support ; good estrogen effect  Cervix: Surgically absent  Uterus: Surgically absent  Adnexa: Normal, nontender  RV: External Exam NormaI, No Rectal Masses and Normal Sphincter tone  MUSCULOSKELETAL: Normal range of motion. No tenderness.  No cyanosis, clubbing, or edema.  2+ distal pulses. LYMPHATIC: No Axillary, Supraclavicular, or Inguinal Adenopathy.    Assessment:   Annual gynecologic examination 51 y.o. Contraception: status post hysterectomy bmi- 30 Problem List Items Addressed This Visit    Surgical menopause   Increased BMI   History of anxiety   Diverticulosis   Hyperlipidemia    Other Visit Diagnoses    Well woman exam with routine gynecological exam    -  Primary   Screening for colon cancer          Plan:  Pap: Not needed Mammogram: thru employer Stool Guaiac Testing:  Ordered Labs: lipid vit d fbs a1c tsh Routine preventative health maintenance measures emphasized: Exercise/Diet/Weight control, Tobacco Warnings and Alcohol/Substance use risks 1. Continue using estradiol patch 2. Continue with weight loss. Current goal of 180 lbs; advised to lose 20 lbs over the next two years 3. Given stool cards for colon cancer screening 4. Mammogram due this year Return to Clinic - 1 Year   Joanna Fields, CMA    I have seen, interviewed, and examined the patient in conjunction with the [employer] student and affirm the diagnosis and management plan. Joanna A. DeFrancesco, MD, FACOG   Note: This dictation was prepared with Dragon dictation along with smaller phrase technology. Any transcriptional errors that result from this process are unintentional.

## 2016-11-07 ENCOUNTER — Encounter: Payer: BLUE CROSS/BLUE SHIELD | Admitting: Obstetrics and Gynecology

## 2016-12-10 ENCOUNTER — Other Ambulatory Visit: Payer: Self-pay | Admitting: Obstetrics and Gynecology

## 2016-12-18 ENCOUNTER — Encounter: Payer: BLUE CROSS/BLUE SHIELD | Admitting: Obstetrics and Gynecology

## 2016-12-21 NOTE — Progress Notes (Deleted)
Patient ID: Joanna Fields, female   DOB: [birth year], 51 y.o.   MRN: [id number] ANNUAL PREVENTATIVE CARE GYN  ENCOUNTER NOTE  Subjective:       Joanna Fields is a 51 y.o. [phone] female here for a routine annual gynecologic exam.  Current complaints: 1.  Surgical Menopause    Gynecologic History No LMP recorded. Patient is not currently having periods (Reason: Perimenopausal). Contraception: status post hysterectomy Last Pap: 01/2013 neg/neg. Results were: normal Last mammogram: 11/2017birad 1. Results were: normal  Obstetric History OB History  Gravida Para Term Preterm AB Living  2 2 2     2   SAB TAB Ectopic Multiple Live Births          2    # Outcome Date GA Lbr Len/2nd Weight Sex Delivery Anes PTL Lv  2 Term      Vag-Spont   LIV  1 Term      Vag-Spont   LIV      Past Medical History:  Diagnosis Date  . Colitis   . Diverticulitis   . Dysuria   . Hematuria   . Hyperlipemia   . Increased BMI   . Obesity   . Surgical menopause     Past Surgical History:  Procedure Laterality Date  . COLON SURGERY    . COSMETIC SURGERY     on face- dog bite  . HERNIA REPAIR  2004   ventral  . HYSTEROSCOPY W/D&C    . OVARIAN CYST REMOVAL Left 2005  . TUBAL LIGATION    . VAGINAL HYSTERECTOMY  02/2013   [facility] bso    Current Outpatient Prescriptions on File Prior to Visit  Medication Sig Dispense Refill  . calcium-vitamin D (OSCAL WITH D) 250-125 MG-UNIT tablet Take 1 tablet by mouth daily.    Marland Kitchen estradiol (VIVELLE-DOT) 0.1 MG/24HR patch APPLY 1 PATCH TWICE A WEEK 8 patch 0  . Multiple Vitamins-Minerals (MULTIVITAMIN WITH MINERALS) tablet Take 1 tablet by mouth daily.    . Omega-3 Fatty Acids (FISH OIL) 1000 MG CAPS Take by mouth.     No current facility-administered medications on file prior to visit.     Allergies  Allergen Reactions  . Penicillins   . Sulfa Antibiotics     Social History   Social History  . Marital status: Married    Spouse name: N/A  . Number of  children: N/A  . Years of education: N/A   Occupational History  . Not on file.   Social History Main Topics  . Smoking status: Former Games developer  . Smokeless tobacco: Not on file  . Alcohol use No  . Drug use: No  . Sexual activity: Yes    Birth control/ protection: Surgical   Other Topics Concern  . Not on file   Social History Narrative  . No narrative on file    Family History  Problem Relation Age of Onset  . Diabetes Father   . Breast cancer Maternal Aunt   . Ovarian cancer Neg Hx   . Colon cancer Neg Hx     The following portions of the patient's history were reviewed and updated as appropriate: allergies, current medications, past family history, past medical history, past social history, past surgical history and problem list.  Review of Systems ROS  Objective:   There were no vitals taken for this visit. CONSTITUTIONAL: Well-developed, well-nourished female in no acute distress.  PSYCHIATRIC: Normal mood and affect. Normal behavior. Normal judgment and thought content. NEUROLGIC: Alert  and oriented to person, place, and time. Normal muscle tone coordination. No cranial nerve deficit noted. HENT:  Normocephalic, atraumatic, External right and left ear normal. Oropharynx is clear and moist EYES: Conjunctivae and EOM are normal. No scleral icterus.  NECK: Normal range of motion, supple, no masses.  Normal thyroid.  SKIN: Skin is warm and dry. No rash noted. Not diaphoretic. No erythema. No pallor. CARDIOVASCULAR: Normal heart rate noted, regular rhythm, no murmur. RESPIRATORY: Clear to auscultation bilaterally. Effort and breath sounds normal, no problems with respiration noted. BREASTS: Symmetric in size. No masses, skin changes, nipple drainage, or lymphadenopathy. ABDOMEN: Soft, normal bowel sounds, no distention noted.  No tenderness, rebound or guarding.  BLADDER: Normal PELVIC:  External Genitalia: Normal  BUS: Normal  Vagina: Normal ; good vault support ;  good estrogen effect  Cervix: Surgically absent  Uterus: Surgically absent  Adnexa: Normal, nontender  RV: External Exam NormaI, No Rectal Masses and Normal Sphincter tone  MUSCULOSKELETAL: Normal range of motion. No tenderness.  No cyanosis, clubbing, or edema.  2+ distal pulses. LYMPHATIC: No Axillary, Supraclavicular, or Inguinal Adenopathy.    Assessment:   Annual gynecologic examination 51 y.o. Contraception: status post hysterectomy bmi- 30 Problem List Items Addressed This Visit    Surgical menopause   Increased BMI   History of anxiety   Hyperlipidemia    Other Visit Diagnoses    Well woman exam with routine gynecological exam    -  Primary   Screening for colon cancer          Plan:  Pap: Not needed Mammogram: thru employer Stool Guaiac Testing:  Ordered Labs: lipid vit d fbs a1c tsh Routine preventative health maintenance measures emphasized: Exercise/Diet/Weight control, Tobacco Warnings and Alcohol/Substance use risks 1. Continue using estradiol patch 2. Continue with weight loss. Current goal of 180 lbs; advised to lose 20 lbs over the next two years 3. Given stool cards for colon cancer screening 4. Mammogram due this year Return to Clinic - 1 Year      I have seen, interviewed, and examined the patient in conjunction with the [employer] [facility] and affirm the diagnosis and management plan. Martin A. DeFrancesco, MD, FACOG   Note: This dictation was prepared with Dragon dictation along with smaller phrase technology. Any transcriptional errors that result from this process are unintentional.

## 2016-12-27 ENCOUNTER — Encounter: Payer: BLUE CROSS/BLUE SHIELD | Admitting: Obstetrics and Gynecology

## 2017-01-17 NOTE — Progress Notes (Deleted)
Patient ID: Joanna Fields, female   DOB: 1966/02/05, 51 y.o.   MRN: [id number] ANNUAL PREVENTATIVE CARE GYN  ENCOUNTER NOTE  Subjective:       Joanna Fields is a 51 y.o. [phone] female here for a routine annual gynecologic exam.  Current complaints: 1.  Surgical Menopause    Gynecologic History No LMP recorded. Patient is not currently having periods (Reason: Perimenopausal). Contraception: status post hysterectomy Last Pap:2014 neg/neg. Results were: normal Last mammogram: 11/2017birad 1. Results were: normal  Obstetric History OB History  Gravida Para Term Preterm AB Living  SAB TAB Ectopic Multiple Live Births          2    # Outcome Date GA Lbr Len/2nd Weight Sex Delivery Anes PTL Lv  2 Term      Vag-Spont   LIV  1 Term      Vag-Spont   LIV      Past Medical History:  Diagnosis Date  . Colitis   . Diverticulitis   . Dysuria   . Hematuria   . Hyperlipemia   . Increased BMI   . Obesity   . Surgical menopause     Past Surgical History:  Procedure Laterality Date  . COLON SURGERY    . COSMETIC SURGERY     on face- dog bite  . HERNIA REPAIR  2004   ventral  . HYSTEROSCOPY W/D&C    . OVARIAN CYST REMOVAL Left 2005  . TUBAL LIGATION    . VAGINAL HYSTERECTOMY  02/2013   [facility] bso    Current Outpatient Prescriptions on File Prior to Visit  Medication Sig Dispense Refill  . calcium-vitamin D (OSCAL WITH D) 250-125 MG-UNIT tablet Take 1 tablet by mouth daily.    Marland Kitchen estradiol (VIVELLE-DOT) 0.1 MG/24HR patch APPLY 1 PATCH TWICE A WEEK 8 patch 0  . Multiple Vitamins-Minerals (MULTIVITAMIN WITH MINERALS) tablet Take 1 tablet by mouth daily.    . Omega-3 Fatty Acids (FISH OIL) 1000 MG CAPS Take by mouth.     No current facility-administered medications on file prior to visit.     Allergies  Allergen Reactions  . Penicillins   . Sulfa Antibiotics     Social History   Social History  . Marital status: Married    Spouse name: N/A  . Number of  children: N/A  . Years of education: N/A   Occupational History  . Not on file.   Social History Main Topics  . Smoking status: Former Games developer  . Smokeless tobacco: Not on file  . Alcohol use No  . Drug use: No  . Sexual activity: Yes    Birth control/ protection: Surgical   Other Topics Concern  . Not on file   Social History Narrative  . No narrative on file    Family History  Problem Relation Age of Onset  . Diabetes Father   . Breast cancer Maternal Aunt   . Ovarian cancer Neg Hx   . Colon cancer Neg Hx     The following portions of the patient's history were reviewed and updated as appropriate: allergies, current medications, past family history, past medical history, past social history, past surgical history and problem list.  Review of Systems ROS  Objective:   There were no vitals taken for this visit. CONSTITUTIONAL: Well-developed, well-nourished female in no acute distress.  PSYCHIATRIC: Normal mood and affect. Normal behavior. Normal judgment and thought content. NEUROLGIC: Alert and  oriented to person, place, and time. Normal muscle tone coordination. No cranial nerve deficit noted. HENT:  Normocephalic, atraumatic, External right and left ear normal. Oropharynx is clear and moist EYES: Conjunctivae and EOM are normal. No scleral icterus.  NECK: Normal range of motion, supple, no masses.  Normal thyroid.  SKIN: Skin is warm and dry. No rash noted. Not diaphoretic. No erythema. No pallor. CARDIOVASCULAR: Normal heart rate noted, regular rhythm, no murmur. RESPIRATORY: Clear to auscultation bilaterally. Effort and breath sounds normal, no problems with respiration noted. BREASTS: Symmetric in size. No masses, skin changes, nipple drainage, or lymphadenopathy. ABDOMEN: Soft, normal bowel sounds, no distention noted.  No tenderness, rebound or guarding.  BLADDER: Normal PELVIC:  External Genitalia: Normal  BUS: Normal  Vagina: Normal ; good vault support ;  good estrogen effect  Cervix: Surgically absent  Uterus: Surgically absent  Adnexa: Normal, nontender  RV: External Exam NormaI, No Rectal Masses and Normal Sphincter tone  MUSCULOSKELETAL: Normal range of motion. No tenderness.  No cyanosis, clubbing, or edema.  2+ distal pulses. LYMPHATIC: No Axillary, Supraclavicular, or Inguinal Adenopathy.    Assessment:   Annual gynecologic examination 51 y.o. Contraception: status post hysterectomy bmi- 30 Problem List Items Addressed This Visit    Surgical menopause   Increased BMI   History of anxiety   Diverticulosis   Hyperlipidemia    Other Visit Diagnoses    Well woman exam with routine gynecological exam    -  Primary   Screening for colon cancer          Plan:  Pap: Not needed Mammogram: thru employer Stool Guaiac Testing:  Ordered Labs: lipid vit d fbs a1c tsh Routine preventative health maintenance measures emphasized: Exercise/Diet/Weight control, Tobacco Warnings and Alcohol/Substance use risks 1. Continue using estradiol patch 2. Continue with weight loss. Current goal of 180 lbs; advised to lose 20 lbs over the next two years 3. Given stool cards for colon cancer screening 4. Mammogram due this year Return to Clinic - 1 Year      I have seen, interviewed, and examined the patient in conjunction with the [facility] [employer] student and affirm the diagnosis and management plan. Martin A. DeFrancesco, MD, FACOG   Note: This dictation was prepared with Dragon dictation along with smaller phrase technology. Any transcriptional errors that result from this process are unintentional.

## 2017-01-22 ENCOUNTER — Other Ambulatory Visit: Payer: Self-pay | Admitting: Obstetrics and Gynecology

## 2017-01-24 ENCOUNTER — Encounter: Payer: BLUE CROSS/BLUE SHIELD | Admitting: Obstetrics and Gynecology

## 2017-02-08 ENCOUNTER — Encounter: Payer: BLUE CROSS/BLUE SHIELD | Admitting: Obstetrics and Gynecology

## 2017-02-13 ENCOUNTER — Encounter: Payer: BLUE CROSS/BLUE SHIELD | Admitting: Obstetrics and Gynecology

## 2017-02-16 ENCOUNTER — Other Ambulatory Visit: Payer: Self-pay | Admitting: Obstetrics and Gynecology

## 2017-02-25 ENCOUNTER — Other Ambulatory Visit: Payer: Self-pay | Admitting: Obstetrics and Gynecology

## 2017-02-25 DIAGNOSIS — Z1231 Encounter for screening mammogram for malignant neoplasm of breast: Secondary | ICD-10-CM

## 2017-03-01 NOTE — Progress Notes (Signed)
Patient ID: Joanna Fields, female   DOB: 06/04/1965, 51 y.o.   MRN: [id number] ANNUAL PREVENTATIVE CARE GYN  ENCOUNTER NOTE  Subjective:       Joanna Fields is a 51 y.o. [phone] female here for a routine annual gynecologic exam.  Current complaints: 1.  Surgical Menopause; vasomotor symptoms are reasonably controlled with the Vivelle-Dot patch 0.1 mg twice weekly; she still gets an occasional night sweat.  She is in here for of her ERT therapy; we will discuss continuing therapy after next years visit.  Bowel and bladder function are normal.  Daughter was diagnosed this year with stage III melanoma on her abdomen; stressors regarding her diagnosis and treatment are stabilized.    Gynecologic History No LMP recorded. Patient is not currently having periods (Reason: Perimenopausal). Contraception: status post hysterectomy Last Pap: 01/2013 neg/neg. Results were: normal Last mammogram: 03/01/2016 birad 1. Results were: normal  Obstetric History OB History  Gravida Para Term Preterm AB Living  2 2 2     2   SAB TAB Ectopic Multiple Live Births          2    # Outcome Date GA Lbr Len/2nd Weight Sex Delivery Anes PTL Lv  2 Term      Vag-Spont   LIV  1 Term      Vag-Spont   LIV      Past Medical History:  Diagnosis Date  . Colitis   . Diverticulitis   . Dysuria   . Hematuria   . Hyperlipemia   . Increased BMI   . Obesity   . Surgical menopause     Past Surgical History:  Procedure Laterality Date  . COLON SURGERY    . COSMETIC SURGERY     on face- dog bite  . HERNIA REPAIR  2004   ventral  . HYSTEROSCOPY W/D&C    . OVARIAN CYST REMOVAL Left 2005  . TUBAL LIGATION    . VAGINAL HYSTERECTOMY  02/2013   [facility] bso    Current Outpatient Medications on File Prior to Visit  Medication Sig Dispense Refill  . calcium-vitamin D (OSCAL WITH D) 250-125 MG-UNIT tablet Take 1 tablet by mouth daily.    Marland Kitchen. estradiol (VIVELLE-DOT) 0.1 MG/24HR patch APPLY 1 PATCH TWICE A WEEK 8 patch 0   . Multiple Vitamins-Minerals (MULTIVITAMIN WITH MINERALS) tablet Take 1 tablet by mouth daily.    . Omega-3 Fatty Acids (FISH OIL) 1000 MG CAPS Take by mouth.     No current facility-administered medications on file prior to visit.     Allergies  Allergen Reactions  . Penicillins   . Sulfa Antibiotics     Social History   Socioeconomic History  . Marital status: Married    Spouse name: Not on file  . Number of children: Not on file  . Years of education: Not on file  . Highest education level: Not on file  Social Needs  . Financial resource strain: Not on file  . Food insecurity - worry: Not on file  . Food insecurity - inability: Not on file  . Transportation needs - medical: Not on file  . Transportation needs - non-medical: Not on file  Occupational History  . Not on file  Tobacco Use  . Smoking status: Former Smoker  Substance and Sexual Activity  . Alcohol use: No  . Drug use: No  . Sexual activity: Yes    Birth control/protection: Surgical  Other Topics Concern  . Not on file  Social History Narrative  . Not on file    Family History  Problem Relation Age of Onset  . Diabetes Father   . Breast cancer Maternal Aunt   . Ovarian cancer Neg Hx   . Colon cancer Neg Hx     The following portions of the patient's history were reviewed and updated as appropriate: allergies, current medications, past family history, past medical history, past social history, past surgical history and problem list.  Review of Systems Review of Systems  Constitutional:       Vasomotor symptoms controlled with ERT  HENT: Negative.   Eyes: Negative.   Respiratory: Negative.   Cardiovascular: Negative.   Gastrointestinal: Negative.   Genitourinary: Negative.   Musculoskeletal: Negative.   Skin: Negative.   Neurological: Negative.   Endo/Heme/Allergies: Negative.   Psychiatric/Behavioral: Negative.     Objective:   BP 127/73   Pulse 97   Ht 5\' 9"  (1.753 m)   Wt 222 lb  3.2 oz (100.8 kg)   BMI 32.81 kg/m  CONSTITUTIONAL: Well-developed, well-nourished female in no acute distress.  PSYCHIATRIC: Normal mood and affect. Normal behavior. Normal judgment and thought content. NEUROLGIC: Alert and oriented to person, place, and time. Normal muscle tone coordination. No cranial nerve deficit noted. HENT:  Normocephalic, atraumatic, External right and left ear normal. Oropharynx is clear and moist EYES: Conjunctivae and EOM are normal. No scleral icterus.  NECK: Normal range of motion, supple, no masses.  Normal thyroid.  SKIN: Skin is warm and dry. No rash noted. Not diaphoretic. No erythema. No pallor. CARDIOVASCULAR: Normal heart rate noted, regular rhythm, no murmur. RESPIRATORY: Clear to auscultation bilaterally. Effort and breath sounds normal, no problems with respiration noted. BREASTS: Symmetric in size. No masses, skin changes, nipple drainage, or lymphadenopathy. ABDOMEN: Soft, normal bowel sounds, no distention noted.  No tenderness, rebound or guarding.  BLADDER: Normal PELVIC: (Physical exam unchanged)  External Genitalia: Normal  BUS: Normal  Vagina: Normal ; good vault support ; good estrogen effect  Cervix: Surgically absent  Uterus: Surgically absent  Adnexa: Normal, nontender  RV: External Exam NormaI, No Rectal Masses and Normal Sphincter tone  MUSCULOSKELETAL: Normal range of motion. No tenderness.  No cyanosis, clubbing, or edema.  2+ distal pulses. LYMPHATIC: No Axillary, Supraclavicular, or Inguinal Adenopathy.    Assessment:   Annual gynecologic examination 51 y.o. Contraception: status post hysterectomy [id number] Problem List Items Addressed This Visit    Surgical menopause   Increased BMI   History of anxiety    Other Visit Diagnoses    Well woman exam with routine gynecological exam    -  Primary      Plan:  Pap: Not needed Mammogram: thru employer- scheduled for 03/06/2017 Stool Guaiac Testing:  Ordered Labs: lipid vit  d fbs a1c tsh Routine preventative health maintenance measures emphasized: Exercise/Diet/Weight control, Tobacco Warnings and Alcohol/Substance use risks 1. Continue using estradiol patch-Vivelle-Dot 0.1 mg twice weekly 2. Continue with healthy eating and exercise and control weight loss with a goal of 180 pounds 3. Given stool cards for colon cancer screening 4. Mammogram due this year [address]  Crystal GreenvilleMiller, [place] Herold HarmsMartin A Aniqa Hare, MD  Note: This dictation was prepared with Dragon dictation along with smaller phrase technology. Any transcriptional errors that result from this process are unintentional.

## 2017-03-05 ENCOUNTER — Encounter: Payer: Self-pay | Admitting: Obstetrics and Gynecology

## 2017-03-05 ENCOUNTER — Ambulatory Visit (INDEPENDENT_AMBULATORY_CARE_PROVIDER_SITE_OTHER): Payer: BLUE CROSS/BLUE SHIELD | Admitting: Obstetrics and Gynecology

## 2017-03-05 VITALS — BP 127/73 | HR 97 | Ht 69.0 in | Wt 222.2 lb

## 2017-03-05 DIAGNOSIS — R638 Other symptoms and signs concerning food and fluid intake: Secondary | ICD-10-CM

## 2017-03-05 DIAGNOSIS — Z01419 Encounter for gynecological examination (general) (routine) without abnormal findings: Secondary | ICD-10-CM | POA: Diagnosis not present

## 2017-03-05 DIAGNOSIS — Z808 Family history of malignant neoplasm of other organs or systems: Secondary | ICD-10-CM | POA: Diagnosis not present

## 2017-03-05 DIAGNOSIS — Z8659 Personal history of other mental and behavioral disorders: Secondary | ICD-10-CM | POA: Diagnosis not present

## 2017-03-05 DIAGNOSIS — E8940 Asymptomatic postprocedural ovarian failure: Secondary | ICD-10-CM

## 2017-03-05 DIAGNOSIS — Z1211 Encounter for screening for malignant neoplasm of colon: Secondary | ICD-10-CM | POA: Diagnosis not present

## 2017-03-05 MED ORDER — ESTRADIOL 0.1 MG/24HR TD PTTW: 1.0000 | MEDICATED_PATCH | TRANSDERMAL | 3 refills | Status: DC

## 2017-03-05 NOTE — Patient Instructions (Addendum)
1.  No Pap smear needed 2.  Mammogram is to be obtained through her employer 3.  Stool guaiac card testing is ordered for colon cancer screening 4.  Screening labs are ordered. 5.  Continue with healthy eating, exercise and controlled weight loss.  Weight loss goal is down to 180 pounds 6.  Continue with estradiol patch for ERT therapy-Vivelle-Dot 0.1 mg twice weekly   Health Maintenance for Postmenopausal Women Menopause is a normal process in which your reproductive ability comes to an end. This process happens gradually over a span of months to years, usually between the ages of 85 and 31. Menopause is complete when you have missed 12 consecutive menstrual periods. It is important to talk with your health care provider about some of the most common conditions that affect postmenopausal women, such as heart disease, cancer, and bone loss (osteoporosis). Adopting a healthy lifestyle and getting preventive care can help to promote your health and wellness. Those actions can also lower your chances of developing some of these common conditions. What should I know about menopause? During menopause, you may experience a number of symptoms, such as:  Moderate-to-severe hot flashes.  Night sweats.  Decrease in sex drive.  Mood swings.  Headaches.  Tiredness.  Irritability.  Memory problems.  Insomnia.  Choosing to treat or not to treat menopausal changes is an individual decision that you make with your health care provider. What should I know about hormone replacement therapy and supplements? Hormone therapy products are effective for treating symptoms that are associated with menopause, such as hot flashes and night sweats. Hormone replacement carries certain risks, especially as you become older. If you are thinking about using estrogen or estrogen with progestin treatments, discuss the benefits and risks with your health care provider. What should I know about heart disease and  stroke? Heart disease, heart attack, and stroke become more likely as you age. This may be due, in part, to the hormonal changes that your body experiences during menopause. These can affect how your body processes dietary fats, triglycerides, and cholesterol. Heart attack and stroke are both medical emergencies. There are many things that you can do to help prevent heart disease and stroke:  Have your blood pressure checked at least every 1-2 years. High blood pressure causes heart disease and increases the risk of stroke.  If you are 34-4 years old, ask your health care provider if you should take aspirin to prevent a heart attack or a stroke.  Do not use any tobacco products, including cigarettes, chewing tobacco, or electronic cigarettes. If you need help quitting, ask your health care provider.  It is important to eat a healthy diet and maintain a healthy weight. ? Be sure to include plenty of vegetables, fruits, low-fat dairy products, and lean protein. ? Avoid eating foods that are high in solid fats, added sugars, or salt (sodium).  Get regular exercise. This is one of the most important things that you can do for your health. ? Try to exercise for at least 150 minutes each week. The type of exercise that you do should increase your heart rate and make you sweat. This is known as moderate-intensity exercise. ? Try to do strengthening exercises at least twice each week. Do these in addition to the moderate-intensity exercise.  Know your numbers.Ask your health care provider to check your cholesterol and your blood glucose. Continue to have your blood tested as directed by your health care provider.  What should I know about  cancer screening? There are several types of cancer. Take the following steps to reduce your risk and to catch any cancer development as early as possible. Breast Cancer  Practice breast self-awareness. ? This means understanding how your breasts normally appear  and feel. ? It also means doing regular breast self-exams. Let your health care provider know about any changes, no matter how small.  If you are 78 or older, have a clinician do a breast exam (clinical breast exam or CBE) every year. Depending on your age, family history, and medical history, it may be recommended that you also have a yearly breast X-ray (mammogram).  If you have a family history of breast cancer, talk with your health care provider about genetic screening.  If you are at high risk for breast cancer, talk with your health care provider about having an MRI and a mammogram every year.  Breast cancer (BRCA) gene test is recommended for women who have family members with BRCA-related cancers. Results of the assessment will determine the need for genetic counseling and BRCA1 and for BRCA2 testing. BRCA-related cancers include these types: ? Breast. This occurs in males or females. ? Ovarian. ? Tubal. This may also be called fallopian tube cancer. ? Cancer of the abdominal or pelvic lining (peritoneal cancer). ? Prostate. ? Pancreatic.  Cervical, Uterine, and Ovarian Cancer Your health care provider may recommend that you be screened regularly for cancer of the pelvic organs. These include your ovaries, uterus, and vagina. This screening involves a pelvic exam, which includes checking for microscopic changes to the surface of your cervix (Pap test).  For women ages 21-65, health care providers may recommend a pelvic exam and a Pap test every three years. For women ages 32-65, they may recommend the Pap test and pelvic exam, combined with testing for human papilloma virus (HPV), every five years. Some types of HPV increase your risk of cervical cancer. Testing for HPV may also be done on women of any age who have unclear Pap test results.  Other health care providers may not recommend any screening for nonpregnant women who are considered low risk for pelvic cancer and have no  symptoms. Ask your health care provider if a screening pelvic exam is right for you.  If you have had past treatment for cervical cancer or a condition that could lead to cancer, you need Pap tests and screening for cancer for at least 20 years after your treatment. If Pap tests have been discontinued for you, your risk factors (such as having a new sexual partner) need to be reassessed to determine if you should start having screenings again. Some women have medical problems that increase the chance of getting cervical cancer. In these cases, your health care provider may recommend that you have screening and Pap tests more often.  If you have a family history of uterine cancer or ovarian cancer, talk with your health care provider about genetic screening.  If you have vaginal bleeding after reaching menopause, tell your health care provider.  There are currently no reliable tests available to screen for ovarian cancer.  Lung Cancer Lung cancer screening is recommended for adults 25-20 years old who are at high risk for lung cancer because of a history of smoking. A yearly low-dose CT scan of the lungs is recommended if you:  Currently smoke.  Have a history of at least 30 pack-years of smoking and you currently smoke or have quit within the past 15 years. A pack-year is  smoking an average of one pack of cigarettes per day for one year.  Yearly screening should:  Continue until it has been 15 years since you quit.  Stop if you develop a health problem that would prevent you from having lung cancer treatment.  Colorectal Cancer  This type of cancer can be detected and can often be prevented.  Routine colorectal cancer screening usually begins at age 66 and continues through age 91.  If you have risk factors for colon cancer, your health care provider may recommend that you be screened at an earlier age.  If you have a family history of colorectal cancer, talk with your health care  provider about genetic screening.  Your health care provider may also recommend using home test kits to check for hidden blood in your stool.  A small camera at the end of a tube can be used to examine your colon directly (sigmoidoscopy or colonoscopy). This is done to check for the earliest forms of colorectal cancer.  Direct examination of the colon should be repeated every 5-10 years until age 62. However, if early forms of precancerous polyps or small growths are found or if you have a family history or genetic risk for colorectal cancer, you may need to be screened more often.  Skin Cancer  Check your skin from head to toe regularly.  Monitor any moles. Be sure to tell your health care provider: ? About any new moles or changes in moles, especially if there is a change in a mole's shape or color. ? If you have a mole that is larger than the size of a pencil eraser.  If any of your family members has a history of skin cancer, especially at a young age, talk with your health care provider about genetic screening.  Always use sunscreen. Apply sunscreen liberally and repeatedly throughout the day.  Whenever you are outside, protect yourself by wearing long sleeves, pants, a wide-brimmed hat, and sunglasses.  What should I know about osteoporosis? Osteoporosis is a condition in which bone destruction happens more quickly than new bone creation. After menopause, you may be at an increased risk for osteoporosis. To help prevent osteoporosis or the bone fractures that can happen because of osteoporosis, the following is recommended:  If you are 34-44 years old, get at least 1,000 mg of calcium and at least 600 mg of vitamin D per day.  If you are older than age 7 but younger than age 19, get at least 1,200 mg of calcium and at least 600 mg of vitamin D per day.  If you are older than age 42, get at least 1,200 mg of calcium and at least 800 mg of vitamin D per day.  Smoking and excessive  alcohol intake increase the risk of osteoporosis. Eat foods that are rich in calcium and vitamin D, and do weight-bearing exercises several times each week as directed by your health care provider. What should I know about how menopause affects my mental health? Depression may occur at any age, but it is more common as you become older. Common symptoms of depression include:  Low or sad mood.  Changes in sleep patterns.  Changes in appetite or eating patterns.  Feeling an overall lack of motivation or enjoyment of activities that you previously enjoyed.  Frequent crying spells.  Talk with your health care provider if you think that you are experiencing depression. What should I know about immunizations? It is important that you get and maintain  your immunizations. These include:  Tetanus, diphtheria, and pertussis (Tdap) booster vaccine.  Influenza every year before the flu season begins.  Pneumonia vaccine.  Shingles vaccine.  Your health care provider may also recommend other immunizations. This information is not intended to replace advice given to you by your health care provider. Make sure you discuss any questions you have with your health care provider. Document Released: 06/01/2005 Document Revised: 10/28/2015 Document Reviewed: 01/11/2015 Elsevier Interactive Patient Education  2018 [employer].

## 2017-03-06 ENCOUNTER — Ambulatory Visit
Admission: RE | Admit: 2017-03-06 | Discharge: 2017-03-06 | Disposition: A | Discharge status: Home / Self Care | Payer: BLUE CROSS/BLUE SHIELD | Source: Ambulatory Visit | Attending: Obstetrics and Gynecology | Admitting: Obstetrics and Gynecology

## 2017-03-06 DIAGNOSIS — Z1231 Encounter for screening mammogram for malignant neoplasm of breast: Secondary | ICD-10-CM | POA: Diagnosis present

## 2017-03-07 ENCOUNTER — Other Ambulatory Visit: Payer: BLUE CROSS/BLUE SHIELD

## 2017-03-08 LAB — GLUCOSE, RANDOM: GLUCOSE: 92 mg/dL (ref 65–99)

## 2017-03-08 LAB — TSH: TSH: 3.18 u[IU]/mL (ref 0.450–4.500)

## 2017-03-08 LAB — HEMOGLOBIN A1C
Est. average glucose Bld gHb Est-mCnc: 120 mg/dL
HEMOGLOBIN A1C: 5.8 % — AB (ref 4.8–5.6)

## 2017-03-08 LAB — LIPID PANEL
CHOL/HDL RATIO: 3.4 ratio (ref 0.0–4.4)
Cholesterol, Total: 195 mg/dL (ref 100–199)
HDL: 57 mg/dL (ref >39)
LDL Calculated: 118 mg/dL — ABNORMAL HIGH (ref 0–99)
Triglycerides: 102 mg/dL (ref 0–149)
VLDL Cholesterol Cal: 20 mg/dL (ref 5–40)

## 2018-03-04 ENCOUNTER — Other Ambulatory Visit: Payer: Self-pay | Admitting: Obstetrics and Gynecology

## 2018-03-04 DIAGNOSIS — Z1231 Encounter for screening mammogram for malignant neoplasm of breast: Secondary | ICD-10-CM

## 2018-03-10 NOTE — Progress Notes (Deleted)
Patient ID: Joanna Fields, female   DOB: [birth year], 52 y.o.   MRN: [id number] ANNUAL PREVENTATIVE CARE GYN  ENCOUNTER NOTE  Subjective:       Joanna GasterWendy Wood Montemurro is a 52 y.o. [phone] female here for a routine annual gynecologic exam.  Current complaints: 1.    Bowel and bladder function are normal.     Gynecologic History No LMP recorded. Patient has had a hysterectomy. Contraception: status post hysterectomy Last Pap: 01/2013 neg/neg. Results were: normal Last mammogram: 03/06/2017 birad 1. Results were: normal  Obstetric History OB History  Gravida Para Term Preterm AB Living  2 2 2     2   SAB TAB Ectopic Multiple Live Births          2    # Outcome Date GA Lbr Len/2nd Weight Sex Delivery Anes PTL Lv  2 Term      Vag-Spont   LIV  1 Term      Vag-Spont   LIV    Past Medical History:  Diagnosis Date  . Colitis   . Diverticulitis   . Dysuria   . Hematuria   . Hyperlipemia   . Increased BMI   . Obesity   . Surgical menopause     Past Surgical History:  Procedure Laterality Date  . COLON SURGERY    . COSMETIC SURGERY     on face- dog bite  . HERNIA REPAIR  2004   ventral  . HYSTEROSCOPY W/D&C    . OVARIAN CYST REMOVAL Left 2005  . TUBAL LIGATION    . VAGINAL HYSTERECTOMY  02/2013   [facility] bso    Current Outpatient Medications on File Prior to Visit  Medication Sig Dispense Refill  . calcium-vitamin D (OSCAL WITH D) 250-125 MG-UNIT tablet Take 1 tablet by mouth daily.    Marland Kitchen. estradiol (VIVELLE-DOT) 0.1 MG/24HR patch Place 1 patch (0.1 mg total) 2 (two) times a week onto the skin. 24 patch 3   No current facility-administered medications on file prior to visit.     Allergies  Allergen Reactions  . Penicillins   . Sulfa Antibiotics     Social History   Socioeconomic History  . Marital status: Married    Spouse name: Not on file  . Number of children: Not on file  . Years of education: Not on file  . Highest education level: Not on file  Occupational  History  . Not on file  Social Needs  . Financial resource strain: Not on file  . Food insecurity:    Worry: Not on file    Inability: Not on file  . Transportation needs:    Medical: Not on file    Non-medical: Not on file  Tobacco Use  . Smoking status: Former Games developermoker  . Smokeless tobacco: Never Used  Substance and Sexual Activity  . Alcohol use: No  . Drug use: No  . Sexual activity: Yes    Birth control/protection: Surgical  Lifestyle  . Physical activity:    Days per week: 3 days    Minutes per session: 60 min  . Stress: Not at all  Relationships  . Social connections:    Talks on phone: Not on file    Gets together: Not on file    Attends religious service: Not on file    Active member of club or organization: Not on file    Attends meetings of clubs or organizations: Not on file    Relationship status: Not on file  .  Intimate partner violence:    Fear of current or ex partner: No    Emotionally abused: No    Physically abused: No    Forced sexual activity: No  Other Topics Concern  . Not on file  Social History Narrative  . Not on file    Family History  Problem Relation Age of Onset  . Diabetes Father   . Breast cancer Maternal Aunt   . Melanoma Daughter   . Ovarian cancer Neg Hx   . Colon cancer Neg Hx     The following portions of the patient's history were reviewed and updated as appropriate: allergies, current medications, past family history, past medical history, past social history, past surgical history and problem list.  Review of Systems   Objective:   There were no vitals taken for this visit. CONSTITUTIONAL: Well-developed, well-nourished female in no acute distress.  PSYCHIATRIC: Normal mood and affect. Normal behavior. Normal judgment and thought content. NEUROLGIC: Alert and oriented to person, place, and time. Normal muscle tone coordination. No cranial nerve deficit noted. HENT:  Normocephalic, atraumatic, External right and left  ear normal. Oropharynx is clear and moist EYES: Conjunctivae and EOM are normal. No scleral icterus.  NECK: Normal range of motion, supple, no masses.  Normal thyroid.  SKIN: Skin is warm and dry. No rash noted. Not diaphoretic. No erythema. No pallor. CARDIOVASCULAR: Normal heart rate noted, regular rhythm, no murmur. RESPIRATORY: Clear to auscultation bilaterally. Effort and breath sounds normal, no problems with respiration noted. BREASTS: Symmetric in size. No masses, skin changes, nipple drainage, or lymphadenopathy. ABDOMEN: Soft, normal bowel sounds, no distention noted.  No tenderness, rebound or guarding.  BLADDER: Normal PELVIC: (Physical exam unchanged)  External Genitalia: Normal  BUS: Normal  Vagina: Normal ; good vault support ; good estrogen effect  Cervix: Surgically absent  Uterus: Surgically absent  Adnexa: Normal, nontender  RV: External Exam NormaI, No Rectal Masses and Normal Sphincter tone  MUSCULOSKELETAL: Normal range of motion. No tenderness.  No cyanosis, clubbing, or edema.  2+ distal pulses. LYMPHATIC: No Axillary, Supraclavicular, or Inguinal Adenopathy.    Assessment:   Annual gynecologic examination 52 y.o. Contraception: status post hysterectomy Bmi:  Problem List Items Addressed This Visit    None      Plan:  Pap: Not needed Mammogram: thru employer- scheduled for 03/12/2018 Stool Guaiac Testing:  Ordered Labs: lipid vit d fbs a1c tsh Routine preventative health maintenance measures emphasized: Exercise/Diet/Weight control, Tobacco Warnings and Alcohol/Substance use risks 1. Continue using estradiol patch-Vivelle-Dot 0.1 mg twice weekly 2. Continue with healthy eating and exercise and control weight loss with a goal of 180 pounds 3. Given stool cards for colon cancer screening 4. Mammogram due this year Return to Clinic - 1 [address] [place], CMA Dorian Pod, CMA   Britt Bottom CMA acting as scribe for Dr. Greggory Keen.   Note:  This dictation was prepared with Dragon dictation along with smaller phrase technology. Any transcriptional errors that result from this process are unintentional.

## 2018-03-11 ENCOUNTER — Encounter: Payer: BLUE CROSS/BLUE SHIELD | Admitting: Obstetrics and Gynecology

## 2018-03-12 ENCOUNTER — Ambulatory Visit
Admission: RE | Admit: 2018-03-12 | Discharge: 2018-03-12 | Disposition: A | Discharge status: Home / Self Care | Payer: BLUE CROSS/BLUE SHIELD | Source: Ambulatory Visit | Attending: Obstetrics and Gynecology | Admitting: Obstetrics and Gynecology

## 2018-03-12 DIAGNOSIS — Z1231 Encounter for screening mammogram for malignant neoplasm of breast: Secondary | ICD-10-CM | POA: Insufficient documentation

## 2018-03-24 ENCOUNTER — Other Ambulatory Visit: Payer: Self-pay | Admitting: Obstetrics and Gynecology

## 2018-06-30 ENCOUNTER — Other Ambulatory Visit: Payer: Self-pay | Admitting: Obstetrics and Gynecology

## 2018-06-30 NOTE — Telephone Encounter (Signed)
Patient has not been seen since 2018 by Dr. Algis Downs. I tried to call patient to schedule. Mailbox is full.

## 2018-10-27 ENCOUNTER — Other Ambulatory Visit: Payer: Self-pay

## 2018-10-27 ENCOUNTER — Ambulatory Visit
Admission: RE | Admit: 2018-10-27 | Discharge: 2018-10-27 | Disposition: A | Discharge status: Home / Self Care | Payer: BC Managed Care – PPO | Source: Ambulatory Visit | Attending: Family Medicine | Admitting: Family Medicine

## 2018-10-27 ENCOUNTER — Other Ambulatory Visit: Payer: Self-pay | Admitting: Family Medicine

## 2018-10-27 DIAGNOSIS — K5792 Diverticulitis of intestine, part unspecified, without perforation or abscess without bleeding: Secondary | ICD-10-CM | POA: Diagnosis present

## 2018-11-03 ENCOUNTER — Ambulatory Visit: Payer: BLUE CROSS/BLUE SHIELD

## 2018-11-11 ENCOUNTER — Other Ambulatory Visit: Payer: Self-pay | Admitting: Nurse Practitioner

## 2018-11-11 DIAGNOSIS — K5732 Diverticulitis of large intestine without perforation or abscess without bleeding: Secondary | ICD-10-CM

## 2018-11-11 DIAGNOSIS — R1032 Left lower quadrant pain: Secondary | ICD-10-CM

## 2018-11-12 ENCOUNTER — Ambulatory Visit
Admission: RE | Admit: 2018-11-12 | Discharge: 2018-11-12 | Disposition: A | Discharge status: Home / Self Care | Payer: BC Managed Care – PPO | Source: Ambulatory Visit | Attending: Nurse Practitioner | Admitting: Nurse Practitioner

## 2018-11-12 ENCOUNTER — Other Ambulatory Visit: Payer: Self-pay

## 2018-11-12 DIAGNOSIS — R1032 Left lower quadrant pain: Secondary | ICD-10-CM | POA: Insufficient documentation

## 2018-11-12 DIAGNOSIS — K5732 Diverticulitis of large intestine without perforation or abscess without bleeding: Secondary | ICD-10-CM | POA: Insufficient documentation

## 2018-11-12 MED ORDER — IOHEXOL 300 MG/ML  SOLN
100.0000 mL | Freq: Once | INTRAMUSCULAR | Status: AC | PRN
  Administered 2018-11-12: 100 mL via INTRAVENOUS

## 2019-08-26 ENCOUNTER — Ambulatory Visit: Payer: Self-pay | Attending: Internal Medicine

## 2019-08-26 DIAGNOSIS — Z23 Encounter for immunization: Secondary | ICD-10-CM

## 2019-08-26 NOTE — Progress Notes (Signed)
   Covid-19 Vaccination Clinic  Name:  Joanna Fields    MRN: [id number] DOB: 1966/02/09  08/26/2019  [name] was observed post Covid-19 immunization for 15 minutes without incident. She was provided with Vaccine Information Sheet and instruction to access the V-Safe system.   [name] was instructed to call 911 with any severe reactions post vaccine: Marland Kitchen Difficulty breathing  . Swelling of face and throat  . A fast heartbeat  . A bad rash all over body  . Dizziness and weakness   Immunizations Administered    Name Date Dose VIS Date Route   Pfizer COVID-19 Vaccine 08/26/2019  9:31 AM 0.3 mL 06/17/2018 Intramuscular   Manufacturer: [employer], [employer]   Lot: [id number]   NDC: [address]-0

## 2020-05-05 ENCOUNTER — Ambulatory Visit (INDEPENDENT_AMBULATORY_CARE_PROVIDER_SITE_OTHER): Payer: BC Managed Care – PPO | Admitting: Certified Nurse Midwife

## 2020-05-05 ENCOUNTER — Other Ambulatory Visit: Payer: Self-pay

## 2020-05-05 ENCOUNTER — Other Ambulatory Visit: Payer: Self-pay | Admitting: Certified Nurse Midwife

## 2020-05-05 ENCOUNTER — Encounter: Payer: Self-pay | Admitting: Certified Nurse Midwife

## 2020-05-05 VITALS — BP 124/73 | HR 90 | Ht 69.0 in | Wt 234.6 lb

## 2020-05-05 DIAGNOSIS — Z01419 Encounter for gynecological examination (general) (routine) without abnormal findings: Secondary | ICD-10-CM

## 2020-05-05 DIAGNOSIS — Z23 Encounter for immunization: Secondary | ICD-10-CM

## 2020-05-05 DIAGNOSIS — R7303 Prediabetes: Secondary | ICD-10-CM | POA: Diagnosis not present

## 2020-05-05 DIAGNOSIS — Z1231 Encounter for screening mammogram for malignant neoplasm of breast: Secondary | ICD-10-CM

## 2020-05-05 DIAGNOSIS — E7800 Pure hypercholesterolemia, unspecified: Secondary | ICD-10-CM

## 2020-05-05 DIAGNOSIS — Z6834 Body mass index (BMI) 34.0-34.9, adult: Secondary | ICD-10-CM

## 2020-05-05 MED ORDER — TETANUS-DIPHTH-ACELL PERTUSSIS 5-2.5-18.5 LF-MCG/0.5 IM SUSY
0.5000 mL | PREFILLED_SYRINGE | Freq: Once | INTRAMUSCULAR | Status: AC
  Administered 2020-05-06: 0.5 mL via INTRAMUSCULAR

## 2020-05-05 MED ORDER — NYSTATIN-TRIAMCINOLONE 100000-0.1 UNIT/GM-% EX OINT: 1.0000 "application " | TOPICAL_OINTMENT | Freq: Two times a day (BID) | CUTANEOUS | 0 refills | Status: DC

## 2020-05-05 NOTE — Progress Notes (Signed)
I have seen, interviewed, and examined the patient in conjunction with the [employer] and affirm the diagnosis and management plan.   Gunnar Bulla, CNM [facility], [facility] 05/05/20 5:13 PM

## 2020-05-05 NOTE — Patient Instructions (Signed)
Preventive Care 40-56 Years Old, Female Preventive care refers to lifestyle choices and visits with your health care provider that can promote health and wellness. This includes:  A yearly physical exam. This is also called an annual wellness visit.  Regular dental and eye exams.  Immunizations.  Screening for certain conditions.  Healthy lifestyle choices, such as: ? Eating a healthy diet. ? Getting regular exercise. ? Not using drugs or products that contain nicotine and tobacco. ? Limiting alcohol use. What can I expect for my preventive care visit? Physical exam Your health care provider will check your:  Height and weight. These may be used to calculate your BMI (body mass index). BMI is a measurement that tells if you are at a healthy weight.  Heart rate and blood pressure.  Body temperature.  Skin for abnormal spots. Counseling Your health care provider may ask you questions about your:  Past medical problems.  Family's medical history.  Alcohol, tobacco, and drug use.  Emotional well-being.  Home life and relationship well-being.  Sexual activity.  Diet, exercise, and sleep habits.  Work and work environment.  Access to firearms.  Method of birth control.  Menstrual cycle.  Pregnancy history. What immunizations do I need? Vaccines are usually given at various ages, according to a schedule. Your health care provider will recommend vaccines for you based on your age, medical history, and lifestyle or other factors, such as travel or where you work.   What tests do I need? Blood tests  Lipid and cholesterol levels. These may be checked every 5 years, or more often if you are over 50 years old.  Hepatitis C test.  Hepatitis B test. Screening  Lung cancer screening. You may have this screening every year starting at age 55 if you have a 30-pack-year history of smoking and currently smoke or have quit within the past 15 years.  Colorectal cancer  screening. ? All adults should have this screening starting at age 50 and continuing until age 75. ? Your health care provider may recommend screening at age 45 if you are at increased risk. ? You will have tests every 1-10 years, depending on your results and the type of screening test.  Diabetes screening. ? This is done by checking your blood sugar (glucose) after you have not eaten for a while (fasting). ? You may have this done every 1-3 years.  Mammogram. ? This may be done every 1-2 years. ? Talk with your health care provider about when you should start having regular mammograms. This may depend on whether you have a family history of breast cancer.  BRCA-related cancer screening. This may be done if you have a family history of breast, ovarian, tubal, or peritoneal cancers.  Pelvic exam and Pap test. ? This may be done every 3 years starting at age 21. ? Starting at age 30, this may be done every 5 years if you have a Pap test in combination with an HPV test. Other tests  STD (sexually transmitted disease) testing, if you are at risk.  Bone density scan. This is done to screen for osteoporosis. You may have this scan if you are at high risk for osteoporosis. Talk with your health care provider about your test results, treatment options, and if necessary, the need for more tests. Follow these instructions at home: Eating and drinking  Eat a diet that includes fresh fruits and vegetables, whole grains, lean protein, and low-fat dairy products.  Take vitamin and mineral supplements   as recommended by your health care provider.  Do not drink alcohol if: ? Your health care provider tells you not to drink. ? You are pregnant, may be pregnant, or are planning to become pregnant.  If you drink alcohol: ? Limit how much you have to 0-1 drink a day. ? Be aware of how much alcohol is in your drink. In the [place] one drink equals one 12 oz bottle of beer (355 mL), one 5 oz glass of  wine (148 mL), or one 1 oz glass of hard liquor (44 mL).   Lifestyle  Take daily care of your teeth and gums. Brush your teeth every morning and night with fluoride toothpaste. Floss one time each day.  Stay active. Exercise for at least 30 minutes 5 or more days each week.  Do not use any products that contain nicotine or tobacco, such as cigarettes, e-cigarettes, and chewing tobacco. If you need help quitting, ask your health care provider.  Do not use drugs.  If you are sexually active, practice safe sex. Use a condom or other form of protection to prevent STIs (sexually transmitted infections).  If you do not wish to become pregnant, use a form of birth control. If you plan to become pregnant, see your health care provider for a prepregnancy visit.  If told by your health care provider, take low-dose aspirin daily starting at age 50.  Find healthy ways to cope with stress, such as: ? Meditation, yoga, or listening to music. ? Journaling. ? Talking to a trusted person. ? Spending time with friends and family. Safety  Always wear your seat belt while driving or riding in a vehicle.  Do not drive: ? If you have been drinking alcohol. Do not ride with someone who has been drinking. ? When you are tired or distracted. ? While texting.  Wear a helmet and other protective equipment during sports activities.  If you have firearms in your house, make sure you follow all gun safety procedures. What's next?  Visit your health care provider once a year for an annual wellness visit.  Ask your health care provider how often you should have your eyes and teeth checked.  Stay up to date on all vaccines. This information is not intended to replace advice given to you by your health care provider. Make sure you discuss any questions you have with your health care provider. Document Revised: 01/12/2020 Document Reviewed: 12/19/2017 Elsevier Patient Education  2021 Elsevier Inc.   

## 2020-05-05 NOTE — Progress Notes (Addendum)
ANNUAL PREVENTATIVE CARE GYN  ENCOUNTER NOTE  Subjective:       Joanna Fields is a 55 y.o. G32P2002 female here for a routine annual gynecologic exam.  Current complaints: 1.  Red, irritated area on right side of lower abdomen after weight gain  Notes occassional hot flashes that are relieved with home remedies.  Denies difficulty breathing, respiratory distress, chest pain, leg swelling or pain, vaginal bleeding, itching, dryness, or discharge.   Gynecologic History  No LMP recorded. Patient has had a hysterectomy.   Contraception: status post hysterectomy  Last Pap:01/2013. Results were: Neg./Neg.  Last mammogram: 02/2018. Results were: BI-RADS 1  Last colonoscopy: scheduled 05/18/2020  Obstetric History  OB History  Gravida Para Term Preterm AB Living  2 2 2     2   SAB IAB Ectopic Multiple Live Births          2    # Outcome Date GA Lbr Len/2nd Weight Sex Delivery Anes PTL Lv  2 Term 05/02/92 [redacted]w[redacted]d  3799 g F Vag-Spont  N LIV     Complications: Gestational diabetes  1 Term 06/05/89 [redacted]w[redacted]d  3657 g M Vag-Spont  N LIV     Complications: Other Excessive Bleeding    Past Medical History:  Diagnosis Date  . Colitis   . Diverticulitis   . Dysuria   . Hematuria   . Hyperlipemia   . Increased BMI   . Obesity   . Surgical menopause     Past Surgical History:  Procedure Laterality Date  . COLON SURGERY    . COSMETIC SURGERY     on face- dog bite  . DILATION AND CURETTAGE OF UTERUS    . HERNIA REPAIR  2004   ventral  . HYSTEROSCOPY    . HYSTEROSCOPY WITH D & C    . OVARIAN CYST REMOVAL Left 2005  . SMALL INTESTINE SURGERY    . TUBAL LIGATION    . VAGINAL HYSTERECTOMY  02/2013   tvh bso    Current Outpatient Medications on File Prior to Visit  Medication Sig Dispense Refill  . calcium-vitamin D (OSCAL WITH D) 250-125 MG-UNIT tablet Take 1 tablet by mouth daily.    03/2013 estradiol (VIVELLE-DOT) 0.1 MG/24HR patch PLACE 1 PATCH (0.1 MG TOTAL) 2 (TWO) TIMES A WEEK  ONTO THE SKIN. 24 patch 0   No current facility-administered medications on file prior to visit.    Allergies  Allergen Reactions  . Penicillins   . Sulfa Antibiotics     Social History   Socioeconomic History  . Marital status: Married    Spouse name: Not on file  . Number of children: Not on file  . Years of education: Not on file  . Highest education level: Not on file  Occupational History  . Not on file  Tobacco Use  . Smoking status: Former Marland Kitchen  . Smokeless tobacco: Never Used  Vaping Use  . Vaping Use: Never used  Substance and Sexual Activity  . Alcohol use: No  . Drug use: No  . Sexual activity: Yes    Birth control/protection: Surgical  Other Topics Concern  . Not on file  Social History Narrative  . Not on file   Social Determinants of Health   Financial Resource Strain: Not on file  Food Insecurity: Not on file  Transportation Needs: Not on file  Physical Activity: Not on file  Stress: Not on file  Social Connections: Not on file  Intimate Partner Violence: Not on  file    Family History  Problem Relation Age of Onset  . Diabetes Father   . Breast cancer Maternal Aunt   . Melanoma Daughter   . Ovarian cancer Neg Hx   . Colon cancer Neg Hx     The following portions of the patient's history were reviewed and updated as appropriate: allergies, current medications, past family history, past medical history, past social history, past surgical history and problem list.  Review of Systems  ROS- Negative other than what was reported above. Information obtained verbally from patient.   Objective:   BP 124/73   Pulse 90   Ht 5\' 9"  (1.753 m)   Wt 106.4 kg   BMI 34.64 kg/m    CONSTITUTIONAL: Well-developed, well-nourished female in no acute distress.   PSYCHIATRIC: Normal mood and affect. Normal behavior. Normal judgment and thought content.  NEUROLGIC: Alert and oriented to person, place, and time. Normal muscle tone coordination. No  cranial nerve deficit noted  EYES: Conjunctivae and EOM are normal.  NECK: Normal range of motion, supple, no masses.  Normal thyroid.   SKIN: Skin is warm and dry. No rash noted. Not diaphoretic. No erythema. No pallor.  CARDIOVASCULAR: Normal heart rate noted, regular rhythm, no murmur.  RESPIRATORY: Clear to auscultation bilaterally. Effort and breath sounds normal, no problems with respiration noted.  BREASTS: Symmetric in size. No masses, skin changes, nipple drainage, or lymphadenopathy.  ABDOMEN: Soft, normal bowel sounds, no distention noted.  No tenderness, rebound or guarding. Healed midline scar noted. Right lower abdominal flat red rash present.  PELVIC: Declined by patient.    MUSCULOSKELETAL: Normal range of motion. No tenderness.  No cyanosis, clubbing, or edema.    Assessment:   Annual gynecologic examination 55 y.o.   Contraception: status post hysterectomy   Obesity 2   Problem List Items Addressed This Visit   None   Visit Diagnoses    Well woman exam    -  Primary   Relevant Orders   MM 3D SCREEN BREAST BILATERAL   HgB A1c   Lipid Profile   Thyroid Panel With TSH   Vitamin D (25 hydroxy)   Screening mammogram for breast cancer       Relevant Orders   MM 3D SCREEN BREAST BILATERAL   Prediabetes       Relevant Orders   HgB A1c   Elevated LDL cholesterol level       Relevant Orders   Lipid Profile   BMI 34.0-34.9,adult       Relevant Orders   HgB A1c   Lipid Profile   Thyroid Panel With TSH   Vitamin D (25 hydroxy)     Plan:   Pap: Not needed   Mammogram: Ordered, see orders  Labs: See orders, patient will return for fasting labs   Routine preventative health maintenance measures emphasized: Exercise/Diet/Weight control, Tobacco Warnings, Alcohol/Substance use risks, Stress Management and Peer Pressure Issues; see AVS  Rx Mycolog, see orders. Encouraged vitamin E stick to prevent chafing  TDaP and Flu vaccines given today, see  chart  Reviewed red flag symptoms and when to call  RTC for fasting labs and x 1 year for ANNUAL exam or sooner if needed   05-12-1974, [employer] 05/05/20 3:37 PM

## 2020-05-06 ENCOUNTER — Other Ambulatory Visit: Payer: BC Managed Care – PPO

## 2020-05-11 DIAGNOSIS — R7303 Prediabetes: Secondary | ICD-10-CM | POA: Diagnosis not present

## 2020-05-13 ENCOUNTER — Inpatient Hospital Stay: Admission: RE | Admit: 2020-05-13 | Payer: BC Managed Care – PPO | Source: Ambulatory Visit

## 2020-05-18 DIAGNOSIS — Z8719 Personal history of other diseases of the digestive system: Secondary | ICD-10-CM | POA: Diagnosis not present

## 2020-05-18 DIAGNOSIS — K5790 Diverticulosis of intestine, part unspecified, without perforation or abscess without bleeding: Secondary | ICD-10-CM | POA: Diagnosis not present

## 2020-05-18 DIAGNOSIS — Z86010 Personal history of colonic polyps: Secondary | ICD-10-CM | POA: Diagnosis not present

## 2020-05-30 ENCOUNTER — Ambulatory Visit: Payer: BC Managed Care – PPO

## 2020-06-14 ENCOUNTER — Ambulatory Visit
Admission: RE | Admit: 2020-06-14 | Discharge: 2020-06-14 | Disposition: A | Discharge status: Home / Self Care | Payer: BC Managed Care – PPO | Source: Ambulatory Visit | Attending: Certified Nurse Midwife | Admitting: Certified Nurse Midwife

## 2020-06-14 ENCOUNTER — Other Ambulatory Visit: Payer: Self-pay

## 2020-06-14 DIAGNOSIS — Z1231 Encounter for screening mammogram for malignant neoplasm of breast: Secondary | ICD-10-CM | POA: Insufficient documentation

## 2020-06-20 ENCOUNTER — Other Ambulatory Visit: Payer: Self-pay | Admitting: Certified Nurse Midwife

## 2020-06-20 DIAGNOSIS — N6489 Other specified disorders of breast: Secondary | ICD-10-CM

## 2020-06-20 DIAGNOSIS — R928 Other abnormal and inconclusive findings on diagnostic imaging of breast: Secondary | ICD-10-CM

## 2020-06-20 NOTE — Progress Notes (Signed)
Orders signed, see chart.    Joanna Fields, CNM [facility], [facility] 06/20/20 9:52 AM

## 2020-06-24 HISTORY — PX: BREAST CYST ASPIRATION: SHX578

## 2020-06-27 ENCOUNTER — Ambulatory Visit
Admission: RE | Admit: 2020-06-27 | Discharge: 2020-06-27 | Disposition: A | Discharge status: Home / Self Care | Payer: BC Managed Care – PPO | Source: Ambulatory Visit | Attending: Certified Nurse Midwife | Admitting: Certified Nurse Midwife

## 2020-06-27 ENCOUNTER — Other Ambulatory Visit: Payer: Self-pay

## 2020-06-27 DIAGNOSIS — R928 Other abnormal and inconclusive findings on diagnostic imaging of breast: Secondary | ICD-10-CM | POA: Diagnosis not present

## 2020-06-27 DIAGNOSIS — N6321 Unspecified lump in the left breast, upper outer quadrant: Secondary | ICD-10-CM | POA: Diagnosis not present

## 2020-06-27 DIAGNOSIS — N6489 Other specified disorders of breast: Secondary | ICD-10-CM | POA: Insufficient documentation

## 2020-06-27 DIAGNOSIS — R922 Inconclusive mammogram: Secondary | ICD-10-CM | POA: Diagnosis not present

## 2020-06-28 ENCOUNTER — Other Ambulatory Visit: Payer: Self-pay | Admitting: Certified Nurse Midwife

## 2020-06-28 DIAGNOSIS — R928 Other abnormal and inconclusive findings on diagnostic imaging of breast: Secondary | ICD-10-CM

## 2020-06-28 DIAGNOSIS — N6310 Unspecified lump in the right breast, unspecified quadrant: Secondary | ICD-10-CM

## 2020-06-28 NOTE — Progress Notes (Signed)
Orders signed, see chart.    Serafina Royals, CNM [facility], [facility] 06/28/20 10:10 AM

## 2020-07-04 ENCOUNTER — Other Ambulatory Visit: Payer: Self-pay

## 2020-07-04 ENCOUNTER — Ambulatory Visit
Admission: RE | Admit: 2020-07-04 | Discharge: 2020-07-04 | Disposition: A | Discharge status: Home / Self Care | Payer: BC Managed Care – PPO | Source: Ambulatory Visit | Attending: Certified Nurse Midwife | Admitting: Certified Nurse Midwife

## 2020-07-04 DIAGNOSIS — N6310 Unspecified lump in the right breast, unspecified quadrant: Secondary | ICD-10-CM | POA: Insufficient documentation

## 2020-07-04 DIAGNOSIS — R928 Other abnormal and inconclusive findings on diagnostic imaging of breast: Secondary | ICD-10-CM | POA: Diagnosis not present

## 2020-07-13 DIAGNOSIS — R7303 Prediabetes: Secondary | ICD-10-CM | POA: Diagnosis not present

## 2020-07-13 DIAGNOSIS — Z01419 Encounter for gynecological examination (general) (routine) without abnormal findings: Secondary | ICD-10-CM | POA: Diagnosis not present

## 2020-07-13 DIAGNOSIS — E7800 Pure hypercholesterolemia, unspecified: Secondary | ICD-10-CM | POA: Diagnosis not present

## 2020-07-13 DIAGNOSIS — Z6834 Body mass index (BMI) 34.0-34.9, adult: Secondary | ICD-10-CM | POA: Diagnosis not present

## 2020-07-14 LAB — LIPID PANEL
Chol/HDL Ratio: 3.8 ratio (ref 0.0–4.4)
Cholesterol, Total: 207 mg/dL — ABNORMAL HIGH (ref 100–199)
HDL: 54 mg/dL (ref >39)
LDL Chol Calc (NIH): 131 mg/dL — ABNORMAL HIGH (ref 0–99)
Triglycerides: 123 mg/dL (ref 0–149)
VLDL Cholesterol Cal: 22 mg/dL (ref 5–40)

## 2020-07-14 LAB — VITAMIN D 25 HYDROXY (VIT D DEFICIENCY, FRACTURES): Vit D, 25-Hydroxy: 36.2 ng/mL (ref 30.0–100.0)

## 2020-07-14 LAB — THYROID PANEL WITH TSH
Free Thyroxine Index: 1.6 (ref 1.2–4.9)
T3 Uptake Ratio: 20 % — ABNORMAL LOW (ref 24–39)
T4, Total: 8.2 ug/dL (ref 4.5–12.0)
TSH: 3.07 u[IU]/mL (ref 0.450–4.500)

## 2020-07-14 LAB — HEMOGLOBIN A1C
Est. average glucose Bld gHb Est-mCnc: 123 mg/dL
Hgb A1c MFr Bld: 5.9 % — ABNORMAL HIGH (ref 4.8–5.6)

## 2020-08-01 NOTE — Telephone Encounter (Signed)
Please contact patient to schedule weight management appointment with me. Thanks, JML

## 2020-08-16 DIAGNOSIS — Z01818 Encounter for other preprocedural examination: Secondary | ICD-10-CM | POA: Diagnosis not present

## 2020-08-18 ENCOUNTER — Encounter: Payer: BC Managed Care – PPO | Admitting: Certified Nurse Midwife

## 2020-08-19 DIAGNOSIS — Z980 Intestinal bypass and anastomosis status: Secondary | ICD-10-CM | POA: Diagnosis not present

## 2020-08-19 DIAGNOSIS — K5730 Diverticulosis of large intestine without perforation or abscess without bleeding: Secondary | ICD-10-CM | POA: Diagnosis not present

## 2020-08-19 DIAGNOSIS — Z86010 Personal history of colonic polyps: Secondary | ICD-10-CM | POA: Diagnosis not present

## 2020-08-19 DIAGNOSIS — K640 First degree hemorrhoids: Secondary | ICD-10-CM | POA: Diagnosis not present

## 2020-08-19 LAB — HM COLONOSCOPY

## 2020-08-25 ENCOUNTER — Other Ambulatory Visit: Payer: Self-pay

## 2020-08-25 ENCOUNTER — Ambulatory Visit (INDEPENDENT_AMBULATORY_CARE_PROVIDER_SITE_OTHER): Payer: BC Managed Care – PPO | Admitting: Certified Nurse Midwife

## 2020-08-25 ENCOUNTER — Encounter: Payer: Self-pay | Admitting: Certified Nurse Midwife

## 2020-08-25 VITALS — BP 126/85 | HR 78 | Resp 16 | Ht 69.0 in | Wt 240.4 lb

## 2020-08-25 DIAGNOSIS — E669 Obesity, unspecified: Secondary | ICD-10-CM

## 2020-08-25 DIAGNOSIS — E7800 Pure hypercholesterolemia, unspecified: Secondary | ICD-10-CM | POA: Diagnosis not present

## 2020-08-25 DIAGNOSIS — R7303 Prediabetes: Secondary | ICD-10-CM | POA: Diagnosis not present

## 2020-08-25 MED ORDER — PHENTERMINE HCL 37.5 MG PO CAPS: 37.5000 mg | ORAL_CAPSULE | ORAL | 0 refills | Status: DC

## 2020-08-25 NOTE — Patient Instructions (Signed)
Topiramate tablets What is this medicine? TOPIRAMATE (toe PYRE a mate) is used to treat seizures in adults or children with epilepsy. It is also used for the prevention of migraine headaches. This medicine may be used for other purposes; ask your health care provider or pharmacist if you have questions. COMMON BRAND NAME(S): Topamax, Topiragen What should I tell my health care provider before I take this medicine? They need to know if you have any of these conditions:  bleeding disorder  kidney disease  lung disease  suicidal thoughts, plans, or attempt  an unusual or allergic reaction to topiramate, other medicines, foods, dyes, or preservatives  pregnant or trying to get pregnant  breast-feeding How should I use this medicine? Take this medicine by mouth with a glass of water. Follow the directions on the prescription label. Do not cut, crush or chew this medicine. Swallow the tablets whole. You can take it with or without food. If it upsets your stomach, take it with food. Take your medicine at regular intervals. Do not take it more often than directed. Do not stop taking except on your doctor's advice. A special MedGuide will be given to you by the pharmacist with each prescription and refill. Be sure to read this information carefully each time. Talk to your pediatrician regarding the use of this medicine in children. While this drug may be prescribed for children as young as 12 years of age for selected conditions, precautions do apply. Overdosage: If you think you have taken too much of this medicine contact a poison control center or emergency room at once. NOTE: This medicine is only for you. Do not share this medicine with others. What if I miss a dose? If you miss a dose, take it as soon as you can. If your next dose is to be taken in less than 6 hours, then do not take the missed dose. Take the next dose at your regular time. Do not take double or extra doses. What may  interact with this medicine? This medicine may interact with the following medications:  acetazolamide  alcohol  antihistamines for allergy, cough, and cold  aspirin and aspirin-like medicines  atropine  birth control pills  certain medicines for anxiety or sleep  certain medicines for bladder problems like oxybutynin, tolterodine  certain medicines for depression like amitriptyline, fluoxetine, sertraline  certain medicines for seizures like carbamazepine, phenobarbital, phenytoin, primidone, valproic acid, zonisamide  certain medicines for stomach problems like dicyclomine, hyoscyamine  certain medicines for travel sickness like scopolamine  certain medicines for Parkinson's disease like benztropine, trihexyphenidyl  certain medicines that treat or prevent blood clots like warfarin, enoxaparin, dalteparin, apixaban, dabigatran, and rivaroxaban  digoxin  general anesthetics like halothane, isoflurane, methoxyflurane, propofol  hydrochlorothiazide  ipratropium  lithium  medicines that relax muscles for surgery  metformin  narcotic medicines for pain  NSAIDs, medicines for pain and inflammation, like ibuprofen or naproxen  phenothiazines like chlorpromazine, mesoridazine, prochlorperazine, thioridazine  pioglitazone This list may not describe all possible interactions. Give your health care provider a list of all the medicines, herbs, non-prescription drugs, or dietary supplements you use. Also tell them if you smoke, drink alcohol, or use illegal drugs. Some items may interact with your medicine. What should I watch for while using this medicine? Visit your doctor or health care professional for regular checks on your progress. Tell your health care professional if your symptoms do not start to get better or if they get worse. Do not stop taking except  on your health care professional's advice. You may develop a severe reaction. Your health care professional  will tell you how much medicine to take. Wear a medical ID bracelet or chain. Carry a card that describes your disease and details of your medicine and dosage times. This medicine can reduce the response of your body to heat or cold. Dress warm in cold weather and stay hydrated in hot weather. If possible, avoid extreme temperatures like saunas, hot tubs, very hot or cold showers, or activities that can cause dehydration such as vigorous exercise. Check with your health care professional if you have severe diarrhea, nausea, and vomiting, or if you sweat a lot. The loss of too much body fluid may make it dangerous for you to take this medicine. You may get drowsy or dizzy. Do not drive, use machinery, or do anything that needs mental alertness until you know how this medicine affects you. Do not stand up or sit up quickly, especially if you are an older patient. This reduces the risk of dizzy or fainting spells. Alcohol may interfere with the effect of this medicine. Avoid alcoholic drinks. Tell your health care professional right away if you have any change in your eyesight. Patients and their families should watch out for new or worsening depression or thoughts of suicide. Also watch out for sudden changes in feelings such as feeling anxious, agitated, panicky, irritable, hostile, aggressive, impulsive, severely restless, overly excited and hyperactive, or not being able to sleep. If this happens, especially at the beginning of treatment or after a change in dose, call your healthcare professional. This medicine may cause serious skin reactions. They can happen weeks to months after starting the medicine. Contact your health care provider right away if you notice fevers or flu-like symptoms with a rash. The rash may be red or purple and then turn into blisters or peeling of the skin. Or, you might notice a red rash with swelling of the face, lips or lymph nodes in your neck or under your arms. Birth control  may not work properly while you are taking this medicine. Talk to your health care professional about using an extra method of birth control. Women should inform their health care professional if they wish to become pregnant or think they might be pregnant. There is a potential for serious side effects and harm to an unborn child. Talk to your health care professional for more information. What side effects may I notice from receiving this medicine? Side effects that you should report to your doctor or health care professional as soon as possible:  allergic reactions like skin rash, itching or hives, swelling of the face, lips, or tongue  blood in the urine  changes in vision  confusion  loss of memory  pain in lower back or side  pain when urinating  redness, blistering, peeling or loosening of the skin, including inside the mouth  signs and symptoms of bleeding such as bloody or black, tarry stools; red or dark brown urine; spitting up blood or brown material that looks like coffee grounds; red spots on the skin; unusual bruising or bleeding from the eyes, gums, or nose  signs and symptoms of increased acid in the body like breathing fast; fast heartbeat; headache; confusion; unusually weak or tired; nausea, vomiting  suicidal thoughts, mood changes  trouble speaking or understanding  unusual sweating  unusually weak or tired Side effects that usually do not require medical attention (report to your doctor or health care   professional if they continue or are bothersome):  dizziness  drowsiness  fever  loss of appetite  nausea, vomiting  pain, tingling, numbness in the hands or feet  stomach pain  tiredness  upset stomach This list may not describe all possible side effects. Call your doctor for medical advice about side effects. You may report side effects to FDA at 1-800-FDA-1088. Where should I keep my medicine? Keep out of the reach of children and  pets. Store between 15 and 30 degrees C (59 and 86 degrees F). Protect from moisture. Keep the container tightly closed. Get rid of any unused medicine after the expiration date. To get rid of medicines that are no longer needed or have expired:  Take the medicine to a medicine take-back program. Check with your pharmacy or law enforcement to find a location.  If you cannot return the medicine, check the label or package insert to see if the medicine should be thrown out in the garbage or flushed down the toilet. If you are not sure, ask your health care provider. If it is safe to put it in the trash, empty the medicine out of the container. Mix the medicine with cat litter, dirt, coffee grounds, or other unwanted substance. Seal the mixture in a bag or container. Put it in the trash. NOTE: This sheet is a summary. It may not cover all possible information. If you have questions about this medicine, talk to your doctor, pharmacist, or health care provider.  2021 Elsevier/Gold Standard (2019-10-22 15:41:57)   Calorie Counting for Weight Loss Calories are units of energy. Your body needs a certain number of calories from food to keep going throughout the day. When you eat or drink more calories than your body needs, your body stores the extra calories mostly as fat. When you eat or drink fewer calories than your body needs, your body burns fat to get the energy it needs. Calorie counting means keeping track of how many calories you eat and drink each day. Calorie counting can be helpful if you need to lose weight. If you eat fewer calories than your body needs, you should lose weight. Ask your health care provider what a healthy weight is for you. For calorie counting to work, you will need to eat the right number of calories each day to lose a healthy amount of weight per week. A dietitian can help you figure out how many calories you need in a day and will suggest ways to reach your calorie goal.  A  healthy amount of weight to lose each week is usually 1-2 lb (0.5-0.9 kg). This usually means that your daily calorie intake should be reduced by 500-750 calories.  Eating 1,200-1,500 calories a day can help most women lose weight.  Eating 1,500-1,800 calories a day can help most men lose weight. What do I need to know about calorie counting? Work with your health care provider or dietitian to determine how many calories you should get each day. To meet your daily calorie goal, you will need to:  Find out how many calories are in each food that you would like to eat. Try to do this before you eat.  Decide how much of the food you plan to eat.  Keep a food log. Do this by writing down what you ate and how many calories it had. To successfully lose weight, it is important to balance calorie counting with a healthy lifestyle that includes regular activity. Where do I find calorie  information? The number of calories in a food can be found on a Nutrition Facts label. If a food does not have a Nutrition Facts label, try to look up the calories online or ask your dietitian for help. Remember that calories are listed per serving. If you choose to have more than one serving of a food, you will have to multiply the calories per serving by the number of servings you plan to eat. For example, the label on a package of bread might say that a serving size is 1 slice and that there are 90 calories in a serving. If you eat 1 slice, you will have eaten 90 calories. If you eat 2 slices, you will have eaten 180 calories.   How do I keep a food log? After each time that you eat, record the following in your food log as soon as possible:  What you ate. Be sure to include toppings, sauces, and other extras on the food.  How much you ate. This can be measured in cups, ounces, or number of items.  How many calories were in each food and drink.  The total number of calories in the food you ate. Keep your food log  near you, such as in a pocket-sized notebook or on an app or website on your mobile phone. Some programs will calculate calories for you and show you how many calories you have left to meet your daily goal. What are some portion-control tips?  Know how many calories are in a serving. This will help you know how many servings you can have of a certain food.  Use a measuring cup to measure serving sizes. You could also try weighing out portions on a kitchen scale. With time, you will be able to estimate serving sizes for some foods.  Take time to put servings of different foods on your favorite plates or in your favorite bowls and cups so you know what a serving looks like.  Try not to eat straight from a food's packaging, such as from a bag or box. Eating straight from the package makes it hard to see how much you are eating and can lead to overeating. Put the amount you would like to eat in a cup or on a plate to make sure you are eating the right portion.  Use smaller plates, glasses, and bowls for smaller portions and to prevent overeating.  Try not to multitask. For example, avoid watching TV or using your computer while eating. If it is time to eat, sit down at a table and enjoy your food. This will help you recognize when you are full. It will also help you be more mindful of what and how much you are eating. What are tips for following this plan? Reading food labels  Check the calorie count compared with the serving size. The serving size may be smaller than what you are used to eating.  Check the source of the calories. Try to choose foods that are high in protein, fiber, and vitamins, and low in saturated fat, trans fat, and sodium. Shopping  Read nutrition labels while you shop. This will help you make healthy decisions about which foods to buy.  Pay attention to nutrition labels for low-fat or fat-free foods. These foods sometimes have the same number of calories or more calories  than the full-fat versions. They also often have added sugar, starch, or salt to make up for flavor that was removed with the fat.  Make  a grocery list of lower-calorie foods and stick to it. Cooking  Try to cook your favorite foods in a healthier way. For example, try baking instead of frying.  Use low-fat dairy products. Meal planning  Use more fruits and vegetables. One-half of your plate should be fruits and vegetables.  Include lean proteins, such as chicken, [place], and fish. Lifestyle Each week, aim to do one of the following:  150 minutes of moderate exercise, such as walking.  75 minutes of vigorous exercise, such as running. General information  Know how many calories are in the foods you eat most often. This will help you calculate calorie counts faster.  Find a way of tracking calories that works for you. Get creative. Try different apps or programs if writing down calories does not work for you. What foods should I eat?  Eat nutritious foods. It is better to have a nutritious, high-calorie food, such as an avocado, than a food with few nutrients, such as a bag of potato chips.  Use your calories on foods and drinks that will fill you up and will not leave you hungry soon after eating. ? Examples of foods that fill you up are nuts and nut butters, vegetables, lean proteins, and high-fiber foods such as whole grains. High-fiber foods are foods with more than 5 g of fiber per serving.  Pay attention to calories in drinks. Low-calorie drinks include water and unsweetened drinks. The items listed above may not be a complete list of foods and beverages you can eat. Contact a dietitian for more information.   What foods should I limit? Limit foods or drinks that are not good sources of vitamins, minerals, or protein or that are high in unhealthy fats. These include:  Candy.  Other sweets.  Sodas, specialty coffee drinks, alcohol, and juice. The items listed above may  not be a complete list of foods and beverages you should avoid. Contact a dietitian for more information. How do I count calories when eating out?  Pay attention to portions. Often, portions are much larger when eating out. Try these tips to keep portions smaller: ? Consider sharing a meal instead of getting your own. ? If you get your own meal, eat only half of it. Before you start eating, ask for a container and put half of your meal into it. ? When available, consider ordering smaller portions from the menu instead of full portions.  Pay attention to your food and drink choices. Knowing the way food is cooked and what is included with the meal can help you eat fewer calories. ? If calories are listed on the menu, choose the lower-calorie options. ? Choose dishes that include vegetables, fruits, whole grains, low-fat dairy products, and lean proteins. ? Choose items that are boiled, broiled, grilled, or steamed. Avoid items that are buttered, battered, fried, or served with cream sauce. Items labeled as crispy are usually fried, unless stated otherwise. ? Choose water, low-fat milk, unsweetened iced tea, or other drinks without added sugar. If you want an alcoholic beverage, choose a lower-calorie option, such as a glass of wine or light beer. ? Ask for dressings, sauces, and syrups on the side. These are usually high in calories, so you should limit the amount you eat. ? If you want a salad, choose a garden salad and ask for grilled meats. Avoid extra toppings such as bacon, cheese, or fried items. Ask for the dressing on the side, or ask for olive oil and vinegar or  lemon to use as dressing.  Estimate how many servings of a food you are given. Knowing serving sizes will help you be aware of how much food you are eating at restaurants. Where to find more information  Centers for Disease Control and Prevention: FootballExhibition.com.br  [place] Department of Agriculture: WrestlingReporter.dk Summary  Calorie  counting means keeping track of how many calories you eat and drink each day. If you eat fewer calories than your body needs, you should lose weight.  A healthy amount of weight to lose per week is usually 1-2 lb (0.5-0.9 kg). This usually means reducing your daily calorie intake by 500-750 calories.  The number of calories in a food can be found on a Nutrition Facts label. If a food does not have a Nutrition Facts label, try to look up the calories online or ask your dietitian for help.  Use smaller plates, glasses, and bowls for smaller portions and to prevent overeating.  Use your calories on foods and drinks that will fill you up and not leave you hungry shortly after a meal. This information is not intended to replace advice given to you by your health care provider. Make sure you discuss any questions you have with your health care provider. Document Revised: 05/21/2019 Document Reviewed: 05/21/2019 Elsevier Patient Education  2021 Elsevier Inc.   Phentermine tablets or capsules What is this medicine? PHENTERMINE (FEN ter meen) decreases your appetite. It is used with a reduced calorie diet and exercise to help you lose weight. This medicine may be used for other purposes; ask your health care provider or pharmacist if you have questions. COMMON BRAND NAME(S): Adipex-P, Atti-Plex P, Atti-Plex P Spansule, Fastin, Lomaira, Pro-Fast, Tara-8 What should I tell my health care provider before I take this medicine? They need to know if you have any of these conditions:  agitation or nervousness  diabetes  glaucoma  heart disease  high blood pressure  history of drug abuse or addiction  history of stroke  kidney disease  lung disease called Primary Pulmonary Hypertension (PPH)  taken an MAOI like Carbex, Eldepryl, Marplan, Nardil, or Parnate in last 14 days  taking stimulant medicines for attention disorders, weight loss, or to stay awake  thyroid disease  an unusual or  allergic reaction to phentermine, other medicines, foods, dyes, or preservatives  pregnant or trying to get pregnant  breast-feeding How should I use this medicine? Take this medicine by mouth with a glass of water. Follow the directions on the prescription label. Take your medicine at regular intervals. Do not take it more often than directed. Do not stop taking except on your doctor's advice. Talk to your pediatrician regarding the use of this medicine in children. While this drug may be prescribed for children 17 years or older for selected conditions, precautions do apply. Overdosage: If you think you have taken too much of this medicine contact a poison control center or emergency room at once. NOTE: This medicine is only for you. Do not share this medicine with others. What if I miss a dose? If you miss a dose, take it as soon as you can. If it is almost time for your next dose, take only that dose. Do not take double or extra doses. What may interact with this medicine? Do not take this medicine with any of the following medications:  MAOIs like Carbex, Eldepryl, Marplan, Nardil, and Parnate This medicine may also interact with the following medications:  alcohol  certain medicines for depression, anxiety,  or psychotic disorders  certain medicines for high blood pressure  linezolid  medicines for colds or breathing difficulties like pseudoephedrine or phenylephrine  medicines for diabetes  sibutramine  stimulant medicines for attention disorders, weight loss, or to stay awake This list may not describe all possible interactions. Give your health care provider a list of all the medicines, herbs, non-prescription drugs, or dietary supplements you use. Also tell them if you smoke, drink alcohol, or use illegal drugs. Some items may interact with your medicine. What should I watch for while using this medicine? Visit your doctor or health care provider for regular checks on your  progress. Do not stop taking except on your health care provider's advice. You may develop a severe reaction. Your health care provider will tell you how much medicine to take. Do not take this medicine close to bedtime. It may prevent you from sleeping. You may get drowsy or dizzy. Do not drive, use machinery, or do anything that needs mental alertness until you know how this medicine affects you. Do not stand or sit up quickly, especially if you are an older patient. This reduces the risk of dizzy or fainting spells. Alcohol may increase dizziness and drowsiness. Avoid alcoholic drinks. This medicine may affect blood sugar levels. Ask your healthcare provider if changes in diet or medicines are needed if you have diabetes. Women should inform their health care provider if they wish to become pregnant or think they might be pregnant. Losing weight while pregnant is not advised and may cause harm to the unborn child. Talk to your health care provider for more information. What side effects may I notice from receiving this medicine? Side effects that you should report to your doctor or health care professional as soon as possible:  allergic reactions like skin rash, itching or hives, swelling of the face, lips, or tongue  breathing problems  changes in emotions or moods  changes in vision  chest pain or chest tightness  fast, irregular heartbeat  feeling faint or lightheaded  increased blood pressure  irritable  restlessness  tremors  seizures  signs and symptoms of a stroke like changes in vision; confusion; trouble speaking or understanding; severe headaches; sudden numbness or weakness of the face, arm or leg; trouble walking; dizziness; loss of balance or coordination  unusually weak or tired Side effects that usually do not require medical attention (report to your doctor or health care professional if they continue or are bothersome):  changes in taste  constipation or  diarrhea  dizziness  dry mouth  headache  trouble sleeping  upset stomach This list may not describe all possible side effects. Call your doctor for medical advice about side effects. You may report side effects to FDA at 1-800-FDA-1088. Where should I keep my medicine? Keep out of the reach of children. This medicine can be abused. Keep your medicine in a safe place to protect it from theft. Do not share this medicine with anyone. Selling or giving away this medicine is dangerous and against the law. This medicine may cause harm and death if it is taken by other adults, children, or pets. Return medicine that has not been used to an official disposal site. Contact the DEA at [phone] or your city/county government to find a site. If you cannot return the medicine, mix any unused medicine with a substance like cat litter or coffee grounds. Then throw the medicine away in a sealed container like a sealed bag or coffee can with  a lid. Do not use the medicine after the expiration date. Store at room temperature between 20 and 25 degrees C (68 and 77 degrees F). Keep container tightly closed. NOTE: This sheet is a summary. It may not cover all possible information. If you have questions about this medicine, talk to your doctor, pharmacist, or health care provider.  2021 Elsevier/Gold Standard (2019-02-13 12:54:20)

## 2020-08-25 NOTE — Progress Notes (Signed)
GYN ENCOUNTER NOTE  Subjective:       Joanna Fields is a 55 y.o. [id number] female here for initial weight management visit.   Patient wishes to start Phentermine for weight loss, reports success in the past.   Questions addition of Topamax and B-12 injections.   Denies difficulty breathing or respiratory distress, chest pain, or abdominal pain.    Gynecologic History  No LMP recorded. Patient has had a hysterectomy.  Contraception: status post hysterectomy  Last Pap: 01/2013. Results were: Neg/Neg  Last mammogram: 06/2020. Results were: abnormal, breast cyst aspiration, recommend repeat screening 2023  Last colonoscopy: scheduled  Obstetric History OB History  Gravida Para Term Preterm AB Living  2 2 2     2   SAB IAB Ectopic Multiple Live Births          2    # Outcome Date GA Lbr Len/2nd Weight Sex Delivery Anes PTL Lv  2 Term 05/02/92 [redacted]w[redacted]d  8 lb 6 oz (3.799 kg) F Vag-Spont  N LIV     Complications: Gestational diabetes  1 Term 06/05/89 [redacted]w[redacted]d  8 lb 1 oz (3.657 kg) M Vag-Spont  N LIV     Complications: Other Excessive Bleeding    Past Medical History:  Diagnosis Date  . Colitis   . Diverticulitis   . Dysuria   . Hematuria   . Hyperlipemia   . Increased BMI   . Obesity   . Surgical menopause     Past Surgical History:  Procedure Laterality Date  . COLON SURGERY    . COSMETIC SURGERY     on face- dog bite  . DILATION AND CURETTAGE OF UTERUS    . HERNIA REPAIR  2004   ventral  . HYSTEROSCOPY    . HYSTEROSCOPY WITH D & C    . OVARIAN CYST REMOVAL Left 2005  . SMALL INTESTINE SURGERY    . TUBAL LIGATION    . VAGINAL HYSTERECTOMY  02/2013   tvh bso    Current Outpatient Medications on File Prior to Visit  Medication Sig Dispense Refill  . Multiple Vitamin (MULTIVITAMIN) capsule Take 1 capsule by mouth daily.    03/2013 nystatin-triamcinolone ointment (MYCOLOG) Apply 1 application topically 2 (two) times daily. 30 g 0   No current facility-administered  medications on file prior to visit.    Allergies  Allergen Reactions  . Penicillins   . Sulfa Antibiotics     Social History   Socioeconomic History  . Marital status: Married    Spouse name: Not on file  . Number of children: Not on file  . Years of education: Not on file  . Highest education level: Not on file  Occupational History  . Not on file  Tobacco Use  . Smoking status: Former [employer]  . Smokeless tobacco: Never Used  Vaping Use  . Vaping Use: Never used  Substance and Sexual Activity  . Alcohol use: No  . Drug use: No  . Sexual activity: Yes    Birth control/protection: Surgical  Other Topics Concern  . Not on file  Social History Narrative  . Not on file   Social Determinants of Health   Financial Resource Strain: Not on file  Food Insecurity: Not on file  Transportation Needs: Not on file  Physical Activity: Not on file  Stress: Not on file  Social Connections: Not on file  Intimate Partner Violence: Not on file    Family History  Problem Relation Age of Onset  .  Diabetes Father   . Breast cancer Maternal Aunt   . Melanoma Daughter   . Ovarian cancer Neg Hx   . Colon cancer Neg Hx     The following portions of the patient's history were reviewed and updated as appropriate: allergies, current medications, past family history, past medical history, past social history, past surgical history and problem list.  Review of Systems  ROS negative except as noted above. Information obtained from patient.   Objective:   BP 126/85   Pulse 78   Resp 16   Ht 5\' 9"  (1.753 m)   Wt 240 lb 6.4 oz (109 kg)   BMI 35.50 kg/m    CONSTITUTIONAL: Well-developed, well-nourished female in no acute distress.   WAIST CIRCUMFERENCE: 42 inches.    Assessment:   1. Obesity, Class II, BMI 35-39.9   2. Elevated LDL cholesterol level   3. Prediabetes  Plan:   Discussed policies of weight management program included expected weight loss of at 10% TBW  in three (3) months and patient verbalized understanding.   Education regarding healthy eating and weight loss techniques, see AVS.   Rx Phentermine, see orders.   Reviewed red flag symptoms and when to call.   RTC x 1 month for weight management visit or sooner if needed.    , CNM [facility], [facility] 08/26/20 11:29 AM

## 2020-08-29 ENCOUNTER — Encounter: Payer: Self-pay | Admitting: Certified Nurse Midwife

## 2020-09-22 ENCOUNTER — Encounter: Payer: BC Managed Care – PPO | Admitting: Certified Nurse Midwife

## 2021-05-08 ENCOUNTER — Encounter: Payer: BC Managed Care – PPO | Admitting: Certified Nurse Midwife

## 2021-07-18 DIAGNOSIS — D225 Melanocytic nevi of trunk: Secondary | ICD-10-CM | POA: Diagnosis not present

## 2021-07-18 DIAGNOSIS — D2262 Melanocytic nevi of left upper limb, including shoulder: Secondary | ICD-10-CM | POA: Diagnosis not present

## 2021-07-18 DIAGNOSIS — D2261 Melanocytic nevi of right upper limb, including shoulder: Secondary | ICD-10-CM | POA: Diagnosis not present

## 2021-07-18 DIAGNOSIS — D2271 Melanocytic nevi of right lower limb, including hip: Secondary | ICD-10-CM | POA: Diagnosis not present

## 2021-07-18 DIAGNOSIS — L570 Actinic keratosis: Secondary | ICD-10-CM | POA: Diagnosis not present

## 2021-11-22 ENCOUNTER — Other Ambulatory Visit: Payer: Self-pay

## 2021-11-22 DIAGNOSIS — Z1231 Encounter for screening mammogram for malignant neoplasm of breast: Secondary | ICD-10-CM

## 2021-12-04 ENCOUNTER — Encounter: Payer: BC Managed Care – PPO | Admitting: Certified Nurse Midwife

## 2021-12-05 ENCOUNTER — Ambulatory Visit
Admission: RE | Admit: 2021-12-05 | Discharge: 2021-12-05 | Disposition: A | Discharge status: Home / Self Care | Payer: BC Managed Care – PPO | Source: Ambulatory Visit | Attending: Certified Nurse Midwife | Admitting: Certified Nurse Midwife

## 2021-12-05 DIAGNOSIS — Z1231 Encounter for screening mammogram for malignant neoplasm of breast: Secondary | ICD-10-CM | POA: Diagnosis not present

## 2021-12-19 ENCOUNTER — Ambulatory Visit: Payer: BC Managed Care – PPO | Admitting: Internal Medicine

## 2021-12-19 ENCOUNTER — Encounter: Payer: Self-pay | Admitting: Internal Medicine

## 2021-12-19 VITALS — BP 124/82 | HR 92 | Temp 98.1°F | Resp 16 | Ht 69.0 in | Wt 248.9 lb

## 2021-12-19 DIAGNOSIS — Z13220 Encounter for screening for lipoid disorders: Secondary | ICD-10-CM

## 2021-12-19 DIAGNOSIS — Z1159 Encounter for screening for other viral diseases: Secondary | ICD-10-CM

## 2021-12-19 DIAGNOSIS — R0683 Snoring: Secondary | ICD-10-CM

## 2021-12-19 DIAGNOSIS — F419 Anxiety disorder, unspecified: Secondary | ICD-10-CM | POA: Diagnosis not present

## 2021-12-19 DIAGNOSIS — F5104 Psychophysiologic insomnia: Secondary | ICD-10-CM | POA: Diagnosis not present

## 2021-12-19 DIAGNOSIS — E559 Vitamin D deficiency, unspecified: Secondary | ICD-10-CM | POA: Diagnosis not present

## 2021-12-19 DIAGNOSIS — R635 Abnormal weight gain: Secondary | ICD-10-CM | POA: Diagnosis not present

## 2021-12-19 DIAGNOSIS — E669 Obesity, unspecified: Secondary | ICD-10-CM

## 2021-12-19 DIAGNOSIS — F4321 Adjustment disorder with depressed mood: Secondary | ICD-10-CM | POA: Diagnosis not present

## 2021-12-19 DIAGNOSIS — Z114 Encounter for screening for human immunodeficiency virus [HIV]: Secondary | ICD-10-CM

## 2021-12-19 MED ORDER — TRAZODONE HCL 50 MG PO TABS: 25.0000 mg | ORAL_TABLET | Freq: Every evening | ORAL | 3 refills | Status: DC | PRN

## 2021-12-19 NOTE — Patient Instructions (Addendum)
It was great seeing you today!  Plan discussed at today's visit: -Blood work ordered today, results will be uploaded to MyChart.  -Please call insurance company to see if they cover Wegovy or Ozempic for weight loss  -Start Trazodone 50 mg at night, can increase to 100 mg  -Referral to sleep medicine ordered  Follow up in: 3 months   Take care and let us know if you have any questions or concerns prior to your next visit.  Dr. Caralee Ates

## 2021-12-19 NOTE — Progress Notes (Signed)
New Patient Office Visit  Subjective    Patient ID: Joanna Fields, female    DOB: 07-18-1965  Age: 56 y.o. MRN: [id number]  CC:  Chief Complaint  Patient presents with   Establish Care   Depression    Daughter passed in April see's a therapist wants to see about a med to take at night   Referral    Sleep study for snoring   Obesity    Wants to see about getting weight loss meds    HPI Joanna Fields presents to establish care.  MDD: -Mood status: uncontrolled - daughter passed away about 1 year ago -Current treatment: Nothing currently  Psychotherapy/counseling: yes current; weekly has done 3 sessions  Anxious mood: sometimes Anhedonia: yes Significant weight loss or gain: yes Insomnia: yes hard to stay asleep Fatigue: yes     12/19/2021   11:15 AM 08/25/2020    4:14 PM  Depression screen PHQ 2/9  Decreased Interest 0 0  Down, Depressed, Hopeless 2 0  PHQ - 2 Score 2 0  Altered sleeping 3   Tired, decreased energy 3   Change in appetite 2   Feeling bad or failure about yourself  2   Trouble concentrating 1   Moving slowly or fidgety/restless 0   Suicidal thoughts 0   PHQ-9 Score 13   Difficult doing work/chores Somewhat difficult    Snoring:  -Husband states that the patient is snoring consisently -Denies witnessed apneic events -Wakes up feeling tired, not refreshed. Denies morning headaches but is sleepy throughout the day  Weight Gain: -Had been on Phentermine previously but had heart palpitations and stopped it  -Interested in new weight loss medications, is pre-diabetic   Health Maintenance: -Blood work due -Mammogram 8/23 Birads-1 -Colonoscopy - obtaining records; goes every 5 years   Outpatient Encounter Medications as of 12/19/2021  Medication Sig   Multiple Vitamin (MULTIVITAMIN) capsule Take 1 capsule by mouth daily.   nystatin-triamcinolone ointment (MYCOLOG) Apply 1 application topically 2 (two) times daily.   phentermine 37.5 MG capsule  Take 1 capsule (37.5 mg total) by mouth every morning.   No facility-administered encounter medications on file as of 12/19/2021.    Past Medical History:  Diagnosis Date   Anxiety 08/20/21   Day my Daughter passed   Colitis    Depression 11/30/21   My Daughter passed 08/20/21   Diverticulitis    Dysuria    Hematuria    Hyperlipemia    Increased BMI    Obesity    Sleep apnea Want to be tested   Surgical menopause     Past Surgical History:  Procedure Laterality Date   BREAST CYST ASPIRATION Left 06/24/2020   CHOLECYSTECTOMY  1991   COLON SURGERY     COSMETIC SURGERY     on face- dog bite   DILATION AND CURETTAGE OF UTERUS     HERNIA REPAIR  2004   ventral   HYSTEROSCOPY     HYSTEROSCOPY WITH D & C     OVARIAN CYST REMOVAL Left 2005   SMALL INTESTINE SURGERY     TUBAL LIGATION     VAGINAL HYSTERECTOMY  02/2013   tvh bso    Family History  Problem Relation Age of Onset   Asthma Mother    COPD Mother    Kidney disease Mother    Obesity Mother    Diabetes Father    Hearing loss Father    Melanoma Daughter    Cancer Daughter  Breast cancer Maternal Aunt    Ovarian cancer Neg Hx    Colon cancer Neg Hx     Social History   Socioeconomic History   Marital status: Married    Spouse name: Not on file   Number of children: Not on file   Years of education: Not on file   Highest education level: Not on file  Occupational History   Not on file  Tobacco Use   Smoking status: Former   Smokeless tobacco: Never  Vaping Use   Vaping Use: Never used  Substance and Sexual Activity   Alcohol use: No   Drug use: No   Sexual activity: Yes    Birth control/protection: Surgical  Other Topics Concern   Not on file  Social History Narrative   Not on file   Social Determinants of Health   Financial Resource Strain: Not on file  Food Insecurity: Not on file  Transportation Needs: Not on file  Physical Activity: Sufficiently Active (03/12/2018)   Exercise  Vital Sign    Days of Exercise per Week: 3 days    Minutes of Exercise per Session: 60 min  Stress: No Stress Concern Present (03/12/2018)   [employer] of Occupational Health - Occupational Stress Questionnaire    Feeling of Stress : Not at all  Social Connections: Not on file  Intimate Partner Violence: Not At Risk (03/12/2018)   Humiliation, Afraid, Rape, and Kick questionnaire    Fear of Current or Ex-Partner: No    Emotionally Abused: No    Physically Abused: No    Sexually Abused: No    Review of Systems  Constitutional:  Positive for malaise/fatigue. Negative for chills and fever.  Respiratory:  Negative for shortness of breath.   Cardiovascular:  Negative for chest pain.  Gastrointestinal:  Negative for abdominal pain, nausea and vomiting.  Psychiatric/Behavioral:  The patient has insomnia.         Objective    BP 124/82   Pulse 92   Temp 98.1 F (36.7 C)   Resp 16   Ht 5\' 9"  (1.753 m)   Wt 248 lb 14.4 oz (112.9 kg)   SpO2 99%   BMI 36.76 kg/m   Physical Exam Constitutional:      Appearance: Normal appearance. She is obese.  HENT:     Head: Normocephalic and atraumatic.     Mouth/Throat:     Mouth: Mucous membranes are moist.     Pharynx: Oropharynx is clear.  Eyes:     Extraocular Movements: Extraocular movements intact.     Conjunctiva/sclera: Conjunctivae normal.     Pupils: Pupils are equal, round, and reactive to light.  Cardiovascular:     Rate and Rhythm: Normal rate and regular rhythm.  Pulmonary:     Effort: Pulmonary effort is normal.     Breath sounds: Normal breath sounds.  Musculoskeletal:     Right lower leg: No edema.     Left lower leg: No edema.  Skin:    General: Skin is warm and dry.  Neurological:     General: No focal deficit present.     Mental Status: She is alert. Mental status is at baseline.  Psychiatric:        Mood and Affect: Mood normal.        Behavior: Behavior normal.         Assessment & Plan:    1. Psychophysiological insomnia/Anxiety/Grief: Doing grief counseling currently, which is beneficial. Having a hard time falling  asleep if she wakes up in the night due to grief/anxiety. Will start Trazodone 50 mg before bedtime to help with sleep and anxiety. Follow up in 3 months to recheck.  - traZODone (DESYREL) 50 MG tablet; Take 0.5-1 tablets (25-50 mg total) by mouth at bedtime as needed for sleep.  Dispense: 30 tablet; Refill: 3 - Ambulatory referral to Pulmonology  2. Snoring: Snoring, morning fatigue symptoms as well. Patient will be referred to sleep medicine for sleep study.   - Ambulatory referral to Pulmonology  3. Obesity (BMI 30-39.9)/Weight gain: Discussed GLP1's for weight loss. Will obtain labs and A1c today. Discussed mechanism of action and side effects of medication. Discussed risk for unknown long term side effects of medication. Discussed how lifestyle modification is required for weight loss while on the medication. Patient will call her insurance to see coverage options and we will go from there.   - CBC w/Diff/Platelet - COMPLETE METABOLIC PANEL WITH GFR - HgB J6G  4. Lipid screening/Need for hepatitis C screening test/Encounter for screening for HIV/Vitamin D deficiency: Screening labs today as well.   - Lipid Profile - Hepatitis C Antibody - HIV antibody (with reflex) - Vitamin D (25 hydroxy)   Return in about 3 months (around 03/21/2022).   Margarita Mail, DO

## 2021-12-20 LAB — COMPLETE METABOLIC PANEL WITH GFR
AG Ratio: 1.6 (calc) (ref 1.0–2.5)
ALT: 27 U/L (ref 6–29)
AST: 23 U/L (ref 10–35)
Albumin: 4.6 g/dL (ref 3.6–5.1)
Alkaline phosphatase (APISO): 85 U/L (ref 37–153)
BUN: 15 mg/dL (ref 7–25)
CO2: 25 mmol/L (ref 20–32)
Calcium: 9.3 mg/dL (ref 8.6–10.4)
Chloride: 104 mmol/L (ref 98–110)
Creat: 0.93 mg/dL (ref 0.50–1.03)
Globulin: 2.9 g/dL (calc) (ref 1.9–3.7)
Glucose, Bld: 80 mg/dL (ref 65–99)
Potassium: 4 mmol/L (ref 3.5–5.3)
Sodium: 140 mmol/L (ref 135–146)
Total Bilirubin: 0.3 mg/dL (ref 0.2–1.2)
Total Protein: 7.5 g/dL (ref 6.1–8.1)
eGFR: 72 mL/min/{1.73_m2} (ref >=60)

## 2021-12-20 LAB — LIPID PANEL
Cholesterol: 184 mg/dL (ref <200)
HDL: 57 mg/dL (ref >=50)
LDL Cholesterol (Calc): 103 mg/dL (calc) — ABNORMAL HIGH
Non-HDL Cholesterol (Calc): 127 mg/dL (calc) (ref <130)
Total CHOL/HDL Ratio: 3.2 (calc) (ref <5.0)
Triglycerides: 137 mg/dL (ref <150)

## 2021-12-20 LAB — CBC WITH DIFFERENTIAL/PLATELET
Absolute Monocytes: 371 cells/uL (ref 200–950)
Basophils Absolute: 51 cells/uL (ref 0–200)
Basophils Relative: 0.9 %
Eosinophils Absolute: 103 cells/uL (ref 15–500)
Eosinophils Relative: 1.8 %
HCT: 42.5 % (ref 35.0–45.0)
Hemoglobin: 14.3 g/dL (ref 11.7–15.5)
Lymphs Abs: 2206 cells/uL (ref 850–3900)
MCH: 29.9 pg (ref 27.0–33.0)
MCHC: 33.6 g/dL (ref 32.0–36.0)
MCV: 88.7 fL (ref 80.0–100.0)
MPV: 10.2 fL (ref 7.5–12.5)
Monocytes Relative: 6.5 %
Neutro Abs: 2970 cells/uL (ref 1500–7800)
Neutrophils Relative %: 52.1 %
Platelets: 241 10*3/uL (ref 140–400)
RBC: 4.79 10*6/uL (ref 3.80–5.10)
RDW: 12.4 % (ref 11.0–15.0)
Total Lymphocyte: 38.7 %
WBC: 5.7 10*3/uL (ref 3.8–10.8)

## 2021-12-20 LAB — HEMOGLOBIN A1C
Hgb A1c MFr Bld: 5.9 % of total Hgb — ABNORMAL HIGH (ref <5.7)
Mean Plasma Glucose: 123 mg/dL
eAG (mmol/L): 6.8 mmol/L

## 2021-12-20 LAB — HIV ANTIBODY (ROUTINE TESTING W REFLEX): HIV 1&2 Ab, 4th Generation: NONREACTIVE

## 2021-12-20 LAB — VITAMIN D 25 HYDROXY (VIT D DEFICIENCY, FRACTURES): Vit D, 25-Hydroxy: 31 ng/mL (ref 30–100)

## 2021-12-20 LAB — HEPATITIS C ANTIBODY: Hepatitis C Ab: NONREACTIVE

## 2021-12-28 ENCOUNTER — Encounter: Payer: Self-pay | Admitting: Internal Medicine

## 2022-01-01 ENCOUNTER — Ambulatory Visit (INDEPENDENT_AMBULATORY_CARE_PROVIDER_SITE_OTHER): Payer: BC Managed Care – PPO | Admitting: Primary Care

## 2022-01-01 ENCOUNTER — Encounter: Payer: Self-pay | Admitting: Primary Care

## 2022-01-01 VITALS — BP 124/84 | HR 87 | Temp 98.5°F | Ht 69.0 in | Wt 250.0 lb

## 2022-01-01 DIAGNOSIS — R0683 Snoring: Secondary | ICD-10-CM

## 2022-01-01 NOTE — Patient Instructions (Addendum)
Sleep apnea is defined as period of 10 seconds or longer when you stop breathing at night. This can happen multiple times a night. Dx sleep apnea is when this occurs more than 5 times an hour.    Mild OSA 5-15 apneic events an hour Moderate OSA 15-30 apneic events an hour Severe OSA > 30 apneic events an hour   Untreated sleep apnea puts you at higher risk for cardiac arrhythmias, pulmonary HTN, stroke and diabetes   Treatment options include weight loss, side sleeping position, oral appliance, CPAP therapy or referral to ENT for possible surgical options    Recommendations: Focus on side sleeping position OR weight head 30 degrees with wedge pillow Work on weight loss efforts as able  Do not drive if experiencing excessive daytime sleepiness of fatigue    Orders: Home sleep study re: loud snoring    Follow-up: Please schedule virtual follow-up 1-2 weeks after completing home sleep study to review results and treatment if needed   Sleep Apnea Sleep apnea affects breathing during sleep. It causes breathing to stop for 10 seconds or more, or to become shallow. People with sleep apnea usually snore loudly. It can also increase the risk of: Heart attack. Stroke. Being very overweight (obese). Diabetes. Heart failure. Irregular heartbeat. High blood pressure. The goal of treatment is to help you breathe normally again. What are the causes?  The most common cause of this condition is a collapsed or blocked airway. There are three kinds of sleep apnea: Obstructive sleep apnea. This is caused by a blocked or collapsed airway. Central sleep apnea. This happens when the brain does not send the right signals to the muscles that control breathing. Mixed sleep apnea. This is a combination of obstructive and central sleep apnea. What increases the risk? Being overweight. Smoking. Having a small airway. Being older. Being female. Drinking alcohol. Taking medicines to calm yourself  (sedatives or tranquilizers). Having family members with the condition. Having a tongue or tonsils that are larger than normal. What are the signs or symptoms? Trouble staying asleep. Loud snoring. Headaches in the morning. Waking up gasping. Dry mouth or sore throat in the morning. Being sleepy or tired during the day. If you are sleepy or tired during the day, you may also: Not be able to focus your mind (concentrate). Forget things. Get angry a lot and have mood swings. Feel sad (depressed). Have changes in your personality. Have less interest in sex, if you are female. Be unable to have an erection, if you are female. How is this treated?  Sleeping on your side. Using a medicine to get rid of mucus in your nose (decongestant). Avoiding the use of alcohol, medicines to help you relax, or certain pain medicines (narcotics). Losing weight, if needed. Changing your diet. Quitting smoking. Using a machine to open your airway while you sleep, such as: An oral appliance. This is a mouthpiece that shifts your lower jaw forward. A CPAP device. This device blows air through a mask when you breathe out (exhale). An EPAP device. This has valves that you put in each nostril. A BIPAP device. This device blows air through a mask when you breathe in (inhale) and breathe out. Having surgery if other treatments do not work. Follow these instructions at home: Lifestyle Make changes that your doctor recommends. Eat a healthy diet. Lose weight if needed. Avoid alcohol, medicines to help you relax, and some pain medicines. Do not smoke or use any products that contain nicotine or  tobacco. If you need help quitting, ask your doctor. General instructions Take over-the-counter and prescription medicines only as told by your doctor. If you were given a machine to use while you sleep, use it only as told by your doctor. If you are having surgery, make sure to tell your doctor you have sleep apnea.  You may need to bring your device with you. Keep all follow-up visits. Contact a doctor if: The machine that you were given to use during sleep bothers you or does not seem to be working. You do not get better. You get worse. Get help right away if: Your chest hurts. You have trouble breathing in enough air. You have an uncomfortable feeling in your back, arms, or stomach. You have trouble talking. One side of your body feels weak. A part of your face is hanging down. These symptoms may be an emergency. Get help right away. Call your local emergency services (911 in the [place] Do not wait to see if the symptoms will go away. Do not drive yourself to the hospital. Summary This condition affects breathing during sleep. The most common cause is a collapsed or blocked airway. The goal of treatment is to help you breathe normally while you sleep. This information is not intended to replace advice given to you by your health care provider. Make sure you discuss any questions you have with your health care provider. Document Revised: 11/16/2020 Document Reviewed: 03/18/2020 Elsevier Patient Education  2023 [employer].

## 2022-01-01 NOTE — Progress Notes (Unsigned)
@Patient  ID: , female    DOB: [birth year], 56 y.o.   MRN: [id number]  No chief complaint on file.   Referring provider: [id number], DO  HPI: 56 year old female, former smoker.  Past medical history significant for hyperlipidemia, anxiety, diverticulosis, obesity.  01/01/2022 Patient presents today for sleep study. Patient has symptoms of loud snoring and restless sleep. She wakes herself up several times during the night. She is not sleeping well and does not feel rested when she wakes up. She did recently lose her daughter in April. She takes trazodone for insomnia symptoms. Typical bedtime is between 8 and 9:30 PM.  It takes her on average 15 to 30 minutes to fall asleep.  May  She wakes up 2-3 times at night.  She starts her day between 630 and 7 AM.  Her weight is up 20 pounds in the last 2 years.  No previous sleep studies.  She does not currently wear CPAP or oxygen.  Epworth score is 5.  No symptoms of narcolepsy, cataplexy or sleepwalking.  Sleep questionnaire Symptoms-  Snoring, restless and non-restorative sleep Prior sleep study- None Bedtime- 8-9:30pm Time to fall asleep- 15-30 min  Nocturnal awakenings- 2-3 times Out of bed/start of day- 6:30-7am Weight changes- 20 lbs  Do you operate heavy machinery- No Do you currently wear CPAP- No Do you current wear oxygen- No Epworth- 5  Allergies  Allergen Reactions   Penicillins    Sulfa Antibiotics     Immunization History  Administered Date(s) Administered   Influenza,inj,Quad PF,6+ Mos 02/13/2019, 05/05/2020   PFIZER(Purple Top)SARS-COV-2 Vaccination 08/26/2019   Tdap 05/06/2020    Past Medical History:  Diagnosis Date   Anxiety 08/20/21   Day my Daughter passed   Colitis    Depression 11/30/21   My Daughter passed 08/20/21   Diverticulitis    Dysuria    Hematuria    Hyperlipemia    Increased BMI    Obesity    Sleep apnea Want to be tested   Surgical menopause     Tobacco  History: Social History   Tobacco Use  Smoking Status Former  Smokeless Tobacco Never   Counseling given: Not Answered   Outpatient Medications Prior to Visit  Medication Sig Dispense Refill   Multiple Vitamin (MULTIVITAMIN) capsule Take 1 capsule by mouth daily.     nystatin-triamcinolone ointment (MYCOLOG) Apply 1 application topically 2 (two) times daily. 30 g 0   traZODone (DESYREL) 50 MG tablet Take 0.5-1 tablets (25-50 mg total) by mouth at bedtime as needed for sleep. 30 tablet 3   No facility-administered medications prior to visit.    Review of Systems  Review of Systems  Constitutional:  Positive for fatigue.  HENT: Negative.    Respiratory: Negative.    Cardiovascular: Negative.   Psychiatric/Behavioral:  Positive for sleep disturbance.     Physical Exam  There were no vitals taken for this visit. Physical Exam Constitutional:      General: She is not in acute distress.    Appearance: Normal appearance. She is not ill-appearing.  HENT:     Head: Normocephalic and atraumatic.     Mouth/Throat:     Mouth: Mucous membranes are moist.     Pharynx: Oropharynx is clear.     Comments: Mallampati class I-II Neck:     Comments: Neck 14 inches Cardiovascular:     Rate and Rhythm: Normal rate and regular rhythm.  Pulmonary:     Effort: Pulmonary effort is normal.  Breath sounds: Normal breath sounds.  Skin:    General: Skin is warm and dry.  Neurological:     General: No focal deficit present.     Mental Status: She is alert and oriented to person, place, and time. Mental status is at baseline.  Psychiatric:        Mood and Affect: Mood normal.        Behavior: Behavior normal.        Thought Content: Thought content normal.        Judgment: Judgment normal.      Lab Results:  CBC    Component Value Date/Time   WBC 5.7 12/19/2021 1151   RBC 4.79 12/19/2021 1151   HGB 14.3 12/19/2021 1151   HGB 9.8 (L) 03/12/2013 1602   HCT 42.5 12/19/2021 1151    HCT 28.6 (L) 03/12/2013 1602   PLT 241 12/19/2021 1151   PLT 196 03/12/2013 1602   MCV 88.7 12/19/2021 1151   MCV 89 03/12/2013 1602   MCH 29.9 12/19/2021 1151   MCHC 33.6 12/19/2021 1151   RDW 12.4 12/19/2021 1151   RDW 13.6 03/12/2013 1602   LYMPHSABS 2,206 12/19/2021 1151   LYMPHSABS 2.7 03/12/2013 1602   MONOABS 0.6 03/12/2013 1602   EOSABS 103 12/19/2021 1151   EOSABS 0.3 03/12/2013 1602   BASOSABS 51 12/19/2021 1151   BASOSABS 0.1 03/12/2013 1602    BMET    Component Value Date/Time   NA 140 12/19/2021 1151   NA 141 03/11/2013 0601   K 4.0 12/19/2021 1151   K 4.0 03/11/2013 0601   CL 104 12/19/2021 1151   CL 107 03/11/2013 0601   CO2 25 12/19/2021 1151   CO2 31 03/11/2013 0601   GLUCOSE 80 12/19/2021 1151   GLUCOSE 96 03/11/2013 0601   BUN 15 12/19/2021 1151   BUN 11 03/11/2013 0601   CREATININE 0.93 12/19/2021 1151   CALCIUM 9.3 12/19/2021 1151   CALCIUM 8.3 (L) 03/11/2013 0601   GFRNONAA >60 03/11/2013 0601   GFRAA >60 03/11/2013 0601    BNP No results found for: "BNP"  ProBNP No results found for: "PROBNP"  Imaging: MM 3D SCREEN BREAST BILATERAL  Result Date: 12/07/2021 CLINICAL DATA:  Screening. EXAM: DIGITAL SCREENING BILATERAL MAMMOGRAM WITH TOMOSYNTHESIS AND CAD TECHNIQUE: Bilateral screening digital craniocaudal and mediolateral oblique mammograms were obtained. Bilateral screening digital breast tomosynthesis was performed. The images were evaluated with computer-aided detection. COMPARISON:  Previous exam(s). ACR Breast Density Category b: There are scattered areas of fibroglandular density. FINDINGS: There are no findings suspicious for malignancy. IMPRESSION: No mammographic evidence of malignancy. A result letter of this screening mammogram will be mailed directly to the patient. RECOMMENDATION: Screening mammogram in one year. (Code:SM-B-01Y) BI-RADS CATEGORY  1: Negative. Electronically Signed   By: Romona Curls M.D.   On: 12/07/2021 09:52      Assessment & Plan:   No problem-specific Assessment & Plan notes found for this encounter.     Glenford Bayley, NP 01/01/2022

## 2022-01-02 DIAGNOSIS — R0683 Snoring: Secondary | ICD-10-CM | POA: Insufficient documentation

## 2022-01-02 NOTE — Assessment & Plan Note (Signed)
-   Patient has symptoms of loud snoring, sleep disruption and non-restorative sleep.  Epworth 5/24. BMI  36. Concern patient could have underlying obstructive sleep apnea, needs home sleep study to evaluate.  Reviewed risks of untreated sleep apnea including cardiac arrhythmias, pulmonary hypertension, stroke and diabetes.  We also discussed treatment options including weight loss, oral appliance, CPAP therapy referral to ENT for possible surgical options.  Encourage weight loss efforts and side sleeping position.  Advised against driving if experiencing excessive daytime sleepiness or fatigue.  FU 1-2 weeks after sleep study completed to review results and treatment options if needed.

## 2022-01-02 NOTE — Progress Notes (Signed)
Reviewed and agree with assessment/plan.   Coralyn Helling, MD [facility] Pulmonary/Critical Care 01/02/2022, 9:52 AM Pager:  [phone]

## 2022-02-22 ENCOUNTER — Telehealth: Payer: Self-pay | Admitting: *Deleted

## 2022-02-22 NOTE — Telephone Encounter (Signed)
Called to check status of sleep study. Informed patient the sleep studies are about 12 weeks out. Nothing further needed at this time. Call when sleep study is arranged.

## 2022-02-22 NOTE — Telephone Encounter (Signed)
Judeen Hammans is working on getting this scheduled.

## 2022-02-23 NOTE — Telephone Encounter (Signed)
Spoke to pt & scheduled her hst.  Nothing further needed. 

## 2022-02-26 ENCOUNTER — Encounter: Payer: Self-pay | Admitting: Internal Medicine

## 2022-02-27 ENCOUNTER — Ambulatory Visit: Payer: BC Managed Care – PPO

## 2022-02-27 DIAGNOSIS — G4733 Obstructive sleep apnea (adult) (pediatric): Secondary | ICD-10-CM

## 2022-02-27 DIAGNOSIS — R0683 Snoring: Secondary | ICD-10-CM

## 2022-03-06 DIAGNOSIS — G4733 Obstructive sleep apnea (adult) (pediatric): Secondary | ICD-10-CM | POA: Diagnosis not present

## 2022-03-09 ENCOUNTER — Telehealth: Payer: Self-pay | Admitting: Primary Care

## 2022-03-09 NOTE — Telephone Encounter (Signed)
02/27/22 HST showed severe OSA, AHI 68/hr Please set up virtual visit or in office visit to discuss results and treatment, recommending she be started on CPAP

## 2022-03-12 NOTE — Telephone Encounter (Signed)
Pt is scheduled for an appt 11/22.

## 2022-03-14 ENCOUNTER — Encounter: Payer: Self-pay | Admitting: Primary Care

## 2022-03-14 ENCOUNTER — Ambulatory Visit: Payer: BC Managed Care – PPO | Admitting: Primary Care

## 2022-03-14 ENCOUNTER — Other Ambulatory Visit: Payer: Self-pay

## 2022-03-14 VITALS — BP 128/72 | HR 89 | Temp 98.0°F | Ht 69.0 in | Wt 249.6 lb

## 2022-03-14 DIAGNOSIS — G4730 Sleep apnea, unspecified: Secondary | ICD-10-CM | POA: Diagnosis not present

## 2022-03-14 DIAGNOSIS — Z23 Encounter for immunization: Secondary | ICD-10-CM | POA: Diagnosis not present

## 2022-03-14 NOTE — Assessment & Plan Note (Signed)
-   Patient was seen for sleep consult in September 2023 due to symptoms of snoring, non-restorative and restless sleep.  HST 02/27/22 showed evidence of severe obstructive sleep apnea, average AHI 68/hour with SpO2 low 74% (baseline was 93%). Due to severity of sleep apnea and hypoxemia recommended she be started on auto CPAP 5-20cm h20. We will check ONO on CPAP at follow-up to ensure she does not need supplemental oxygen.  Advised patient aim to wear CPAP every night for minimum 4 to 6 hours or longer.  Encouraged weight loss efforts.  Follow-up in 2 to 3 months for CPAP compliance or sooner if needed.

## 2022-03-14 NOTE — Patient Instructions (Addendum)
Home sleep study showed evidence of severe obstructive sleep apnea, you had on average 68 apneic/hypopneic events an hour with an low oxygen level of 74% (baseline was 93%)  Due to severity of your sleep apnea recommending you be started on auto CPAP  Once you are established on CPAP recommend checking overnight oximetry test to make sure you do not need supplemental oxygen  Recommendations Aim to wear CPAP every night for minimum 4 to 6 hours or longer Work on weight loss efforts as able  If you do not tolerate CPAP we can discuss referral to ENT for possible inspire device  Follow-up: 2-3 months with Beth NP for CPAP compliance or sooner if needed  CPAP and BIPAP Information CPAP and BIPAP are methods that use air pressure to keep your airways open and to help you breathe well. CPAP and BIPAP use different amounts of pressure. Your health care provider will tell you whether CPAP or BIPAP would be more helpful for you. CPAP stands for "continuous positive airway pressure." With CPAP, the amount of pressure stays the same while you breathe in (inhale) and out (exhale). BIPAP stands for "bi-level positive airway pressure." With BIPAP, the amount of pressure will be higher when you inhale and lower when you exhale. This allows you to take larger breaths. CPAP or BIPAP may be used in the hospital, or your health care provider may want you to use it at home. You may need to have a sleep study before your health care provider can order a machine for you to use at home. What are the advantages? CPAP or BIPAP can be helpful if you have: Sleep apnea. Chronic obstructive pulmonary disease (COPD). Heart failure. Medical conditions that cause muscle weakness, including muscular dystrophy or amyotrophic lateral sclerosis (ALS). Other problems that cause breathing to be shallow, weak, abnormal, or difficult. CPAP and BIPAP are most commonly used for obstructive sleep apnea (OSA) to keep the airways from  collapsing when the muscles relax during sleep. What are the risks? Generally, this is a safe treatment. However, problems may occur, including: Irritated skin or skin sores if the mask does not fit properly. Dry or stuffy nose or nosebleeds. Dry mouth. Feeling gassy or bloated. Sinus or lung infection if the equipment is not cleaned properly. When should CPAP or BIPAP be used? In most cases, the mask only needs to be worn during sleep. Generally, the mask needs to be worn throughout the night and during any daytime naps. People with certain medical conditions may also need to wear the mask at other times, such as when they are awake. Follow instructions from your health care provider about when to use the machine. What happens during CPAP or BIPAP?  Both CPAP and BIPAP are provided by a small machine with a flexible plastic tube that attaches to a plastic mask that you wear. Air is blown through the mask into your nose or mouth. The amount of pressure that is used to blow the air can be adjusted on the machine. Your health care provider will set the pressure setting and help you find the best mask for you. Tips for using the mask Because the mask needs to be snug, some people feel trapped or closed-in (claustrophobic) when first using the mask. If you feel this way, you may need to get used to the mask. One way to do this is to hold the mask loosely over your nose or mouth and then gradually apply the mask more snugly. You  can also gradually increase the amount of time that you use the mask. Masks are available in various types and sizes. If your mask does not fit well, talk with your health care provider about getting a different one. Some common types of masks include: Full face masks, which fit over the mouth and nose. Nasal masks, which fit over the nose. Nasal pillow or prong masks, which fit into the nostrils. If you are using a mask that fits over your nose and you tend to breathe through  your mouth, a chin strap may be applied to help keep your mouth closed. Use a skin barrier to protect your skin as told by your health care provider. Some CPAP and BIPAP machines have alarms that may sound if the mask comes off or develops a leak. If you have trouble with the mask, it is very important that you talk with your health care provider about finding a way to make the mask easier to tolerate. Do not stop using the mask. There could be a negative impact on your health if you stop using the mask. Tips for using the machine Place your CPAP or BIPAP machine on a secure table or stand near an electrical outlet. Know where the on/off switch is on the machine. Follow instructions from your health care provider about how to set the pressure on your machine and when you should use it. Do not eat or drink while the CPAP or BIPAP machine is on. Food or fluids could get pushed into your lungs by the pressure of the CPAP or BIPAP. For home use, CPAP and BIPAP machines can be rented or purchased through home health care companies. Many different brands of machines are available. Renting a machine before purchasing may help you find out which particular machine works well for you. Your health insurance company may also decide which machine you may get. Keep the CPAP or BIPAP machine and attachments clean. Ask your health care provider for specific instructions. Check the humidifier if you have a dry stuffy nose or nosebleeds. Make sure it is working correctly. Follow these instructions at home: Take over-the-counter and prescription medicines only as told by your health care provider. Ask if you can take sinus medicine if your sinuses are blocked. Do not use any products that contain nicotine or tobacco. These products include cigarettes, chewing tobacco, and vaping devices, such as e-cigarettes. If you need help quitting, ask your health care provider. Keep all follow-up visits. This is  important. Contact a health care provider if: You have redness or pressure sores on your head, face, mouth, or nose from the mask or head gear. You have trouble using the CPAP or BIPAP machine. You cannot tolerate wearing the CPAP or BIPAP mask. Someone tells you that you snore even when wearing your CPAP or BIPAP. Get help right away if: You have trouble breathing. You feel confused. Summary CPAP and BIPAP are methods that use air pressure to keep your airways open and to help you breathe well. If you have trouble with the mask, it is very important that you talk with your health care provider about finding a way to make the mask easier to tolerate. Do not stop using the mask. There could be a negative impact to your health if you stop using the mask. Follow instructions from your health care provider about when to use the machine. This information is not intended to replace advice given to you by your health care provider. Make sure  you discuss any questions you have with your health care provider. Document Revised: 11/16/2020 Document Reviewed: 03/18/2020 Elsevier Patient Education  2023 [employer].

## 2022-03-14 NOTE — Progress Notes (Signed)
@Patient  ID: , female    DOB: May 18, 1965, 56 y.o.   MRN: [id number]  Chief Complaint  Patient presents with   Follow-up    Referring provider: [id number], DO  HPI: 56 year old female, former smoker.  Past medical history significant for hyperlipidemia, anxiety, diverticulosis, obesity.  Previous [facility] pulmonary encounter: 01/01/2022 Patient presents today for sleep study. Patient has symptoms of loud snoring and restless sleep. She wakes herself up several times during the night. She is not sleeping well and does not feel rested when she wakes up in the morning. She recently lost her daughter in April. She takes Trazodone for insomnia symptoms. Typical bedtime is between 8 and 9:30 PM.  It takes her on average 15 to 30 minutes to fall asleep. She wakes up 2-3 times at night.  She starts her day between 630 and 7 AM.  Her weight is up 20 pounds in the last 2 years.  No previous sleep studies.  She does not currently wear CPAP or oxygen.  Epworth score is 5.  No symptoms of narcolepsy, cataplexy or sleepwalking.  Sleep questionnaire Symptoms-  Snoring, restless and non-restorative sleep Prior sleep study- None Bedtime- 8-9:30pm Time to fall asleep- 15-30 min  Nocturnal awakenings- 2-3 times Out of bed/start of day- 6:30-7am Weight changes- 20 lbs  Do you operate heavy machinery- No Do you currently wear CPAP- No Do you current wear oxygen- No Epworth- 5   03/14/2022- Interim hx  Patient presents today to review sleep study results.  Patient has symptoms of snoring, restless and nonrestorative sleep. HST 02/27/22 showed severe OSA, AHI 68/hour with SpO2 low 74% (baseline 93%).  We reviewed sleep study results and treatment options.  Recommending patient be started on auto CPAP due to severity of her OSA and hypoxemia.  Patient is agreement with plan.    Allergies  Allergen Reactions   Penicillins    Sulfa Antibiotics     Immunization History  Administered  Date(s) Administered   Influenza,inj,Quad PF,6+ Mos 02/13/2019, 05/05/2020, 03/14/2022   PFIZER(Purple Top)SARS-COV-2 Vaccination 08/26/2019   Tdap 05/06/2020    Past Medical History:  Diagnosis Date   Anxiety 08/20/21   Day my Daughter passed   Colitis    Depression 11/30/21   My Daughter passed 08/20/21   Diverticulitis    Dysuria    Hematuria    Hyperlipemia    Increased BMI    Obesity    Sleep apnea Want to be tested   Surgical menopause     Tobacco History: Social History   Tobacco Use  Smoking Status Former   Packs/day: 0.25   Types: Cigarettes   Quit date: 1985   Years since quitting: 38.9   Passive exposure: Never  Smokeless Tobacco Never   Counseling given: Not Answered   Outpatient Medications Prior to Visit  Medication Sig Dispense Refill   Multiple Vitamin (MULTIVITAMIN) capsule Take 1 capsule by mouth daily.     nystatin-triamcinolone ointment (MYCOLOG) Apply 1 application topically 2 (two) times daily. 30 g 0   traZODone (DESYREL) 50 MG tablet Take 0.5-1 tablets (25-50 mg total) by mouth at bedtime as needed for sleep. 30 tablet 3   No facility-administered medications prior to visit.   Review of Systems  Review of Systems  Constitutional:  Positive for fatigue.  HENT: Negative.    Respiratory: Negative.    Psychiatric/Behavioral:  Positive for sleep disturbance.     Physical Exam  BP 128/72 (BP Location: Left Arm,  Patient Position: Sitting, Cuff Size: Large)   Pulse 89   Temp 98 F (36.7 C) (Oral)   Ht 5\' 9"  (1.753 m)   Wt 249 lb 9.6 oz (113.2 kg)   SpO2 94%   BMI 36.86 kg/m  Physical Exam Constitutional:      Appearance: Normal appearance.  HENT:     Head: Normocephalic and atraumatic.     Mouth/Throat:     Mouth: Mucous membranes are moist.     Pharynx: Oropharynx is clear.  Cardiovascular:     Rate and Rhythm: Normal rate.  Pulmonary:     Effort: Pulmonary effort is normal.  Musculoskeletal:        General: Normal range  of motion.  Skin:    General: Skin is warm and dry.  Neurological:     General: No focal deficit present.     Mental Status: She is alert and oriented to person, place, and time. Mental status is at baseline.  Psychiatric:        Mood and Affect: Mood normal.        Behavior: Behavior normal.        Thought Content: Thought content normal.        Judgment: Judgment normal.      Lab Results:  CBC    Component Value Date/Time   WBC 5.7 12/19/2021 1151   RBC 4.79 12/19/2021 1151   HGB 14.3 12/19/2021 1151   HGB 9.8 (L) 03/12/2013 1602   HCT 42.5 12/19/2021 1151   HCT 28.6 (L) 03/12/2013 1602   PLT 241 12/19/2021 1151   PLT 196 03/12/2013 1602   MCV 88.7 12/19/2021 1151   MCV 89 03/12/2013 1602   MCH 29.9 12/19/2021 1151   MCHC 33.6 12/19/2021 1151   RDW 12.4 12/19/2021 1151   RDW 13.6 03/12/2013 1602   LYMPHSABS 2,206 12/19/2021 1151   LYMPHSABS 2.7 03/12/2013 1602   MONOABS 0.6 03/12/2013 1602   EOSABS 103 12/19/2021 1151   EOSABS 0.3 03/12/2013 1602   BASOSABS 51 12/19/2021 1151   BASOSABS 0.1 03/12/2013 1602    BMET    Component Value Date/Time   NA 140 12/19/2021 1151   NA 141 03/11/2013 0601   K 4.0 12/19/2021 1151   K 4.0 03/11/2013 0601   CL 104 12/19/2021 1151   CL 107 03/11/2013 0601   CO2 25 12/19/2021 1151   CO2 31 03/11/2013 0601   GLUCOSE 80 12/19/2021 1151   GLUCOSE 96 03/11/2013 0601   BUN 15 12/19/2021 1151   BUN 11 03/11/2013 0601   CREATININE 0.93 12/19/2021 1151   CALCIUM 9.3 12/19/2021 1151   CALCIUM 8.3 (L) 03/11/2013 0601   GFRNONAA >60 03/11/2013 0601   GFRAA >60 03/11/2013 0601    BNP No results found for: "BNP"  ProBNP No results found for: "PROBNP"  Imaging: No results found.   Assessment & Plan:   Severe sleep apnea - Patient was seen for sleep consult in September 2023 due to symptoms of snoring, non-restorative and restless sleep.  HST 02/27/22 showed evidence of severe obstructive sleep apnea, average AHI 68/hour  with SpO2 low 74% (baseline was 93%). Due to severity of sleep apnea and hypoxemia recommended she be started on auto CPAP 5-20cm h20. We will check ONO on CPAP at follow-up to ensure she does not need supplemental oxygen.  Advised patient aim to wear CPAP every night for minimum 4 to 6 hours or longer.  Encouraged weight loss efforts.  Follow-up in 2 to  3 months for CPAP compliance or sooner if needed.   Glenford Bayley, NP 03/14/2022

## 2022-03-15 NOTE — Progress Notes (Signed)
Reviewed and agree with assessment/plan.   Harris Kistler, MD [facility] 03/15/2022, 8:15 PM Pager:  [phone]  

## 2022-03-27 ENCOUNTER — Other Ambulatory Visit: Payer: Self-pay

## 2022-03-27 DIAGNOSIS — G4730 Sleep apnea, unspecified: Secondary | ICD-10-CM

## 2022-03-27 NOTE — Telephone Encounter (Signed)
All set, thanks!!

## 2022-04-06 DIAGNOSIS — G4733 Obstructive sleep apnea (adult) (pediatric): Secondary | ICD-10-CM | POA: Diagnosis not present

## 2022-04-24 DIAGNOSIS — G4733 Obstructive sleep apnea (adult) (pediatric): Secondary | ICD-10-CM | POA: Diagnosis not present

## 2022-05-07 DIAGNOSIS — G4733 Obstructive sleep apnea (adult) (pediatric): Secondary | ICD-10-CM | POA: Diagnosis not present

## 2022-05-08 ENCOUNTER — Telehealth: Payer: BC Managed Care – PPO | Admitting: Physician Assistant

## 2022-05-08 DIAGNOSIS — F411 Generalized anxiety disorder: Secondary | ICD-10-CM

## 2022-05-08 MED ORDER — HYDROXYZINE PAMOATE 25 MG PO CAPS: 25.0000 mg | ORAL_CAPSULE | Freq: Three times a day (TID) | ORAL | 0 refills | Status: DC | PRN

## 2022-05-08 NOTE — Patient Instructions (Addendum)
  Joanna Schneiders Helget, thank you for joining Leeanne Rio, PA-C for today's virtual visit.  While this provider is not your primary care provider (PCP), if your PCP is located in our provider database this encounter information will be shared with them immediately following your visit.   [facility] account gives you access to today's visit and all your visits, tests, and labs performed at [facility] " click here if you don't have a [facility] account or go to [email or web address]  Consent: (Patient) Joanna Fields provided verbal consent for this virtual visit at the beginning of the encounter.  Current Medications:  Current Outpatient Medications:    Multiple Vitamin (MULTIVITAMIN) capsule, Take 1 capsule by mouth daily., Disp: , Rfl:    nystatin-triamcinolone ointment (MYCOLOG), Apply 1 application topically 2 (two) times daily., Disp: 30 g, Rfl: 0   traZODone (DESYREL) 50 MG tablet, Take 0.5-1 tablets (25-50 mg total) by mouth at bedtime as needed for sleep., Disp: 30 tablet, Rfl: 3   Medications ordered in this encounter:  No orders of the defined types were placed in this encounter.    *If you need refills on other medications prior to your next appointment, please contact your pharmacy*  Follow-Up: Call back or seek an in-person evaluation if the symptoms worsen or if the condition fails to improve as anticipated.  [facility] [phone]  Other Instructions Please keep hydrated. Try to keep a well-balanced diet and stay active. I do recommend mindfulness training. A good app for this on your phone is the Calm app.  See the resources below for counseling. Use the hydroxyzine as directed, when needed for more significant anxiety/irritability. Call your PCP office to schedule a follow-up office visit.     If you have been instructed to have an in-person evaluation today at a local Urgent Care facility, please use the link  below. It will take you to a list of all of our available [facility], including address, phone number and hours of operation. Please do not delay care.  [facility]  If you or a family member do not have a primary care provider, use the link below to schedule a visit and establish care. When you choose a [facility] primary care physician or advanced practice provider, you gain a long-term partner in health. Find a Primary Care Provider  Learn more about [facility]'s in-office and virtual care options: [facility]

## 2022-05-08 NOTE — Progress Notes (Signed)
Virtual Visit Consent   Joanna Fields, you are scheduled for a virtual visit with a [facility] provider today. Just as with appointments in the office, your consent must be obtained to participate. Your consent will be active for this visit and any virtual visit you may have with one of our providers in the next 365 days. If you have a MyChart account, a copy of this consent can be sent to you electronically.  As this is a virtual visit, video technology does not allow for your provider to perform a traditional examination. This may limit your provider's ability to fully assess your condition. If your provider identifies any concerns that need to be evaluated in person or the need to arrange testing (such as labs, EKG, etc.), we will make arrangements to do so. Although advances in technology are sophisticated, we cannot ensure that it will always work on either your end or our end. If the connection with a video visit is poor, the visit may have to be switched to a telephone visit. With either a video or telephone visit, we are not always able to ensure that we have a secure connection.  By engaging in this virtual visit, you consent to the provision of healthcare and authorize for your insurance to be billed (if applicable) for the services provided during this visit. Depending on your insurance coverage, you may receive a charge related to this service.  I need to obtain your verbal consent now. Are you willing to proceed with your visit today? Joanna Fields has provided verbal consent on 05/08/2022 for a virtual visit (video or telephone). Leeanne Rio, [place]  Date: 05/08/2022 3:40 PM  Virtual Visit via Video Note   I, Leeanne Rio, connected with  Joanna Fields  ([id number], [birth year]) on 05/08/22 at  3:30 PM EST by a video-enabled telemedicine application and verified that I am speaking with the correct person using two identifiers.  Location: Patient: Virtual Visit Location  Patient: Home Provider: Virtual Visit Location Provider: Home Office   I discussed the limitations of evaluation and management by telemedicine and the availability of in person appointments. The patient expressed understanding and agreed to proceed.    History of Present Illness: Joanna Fields is a 57 y.o. who identifies as a female who was assigned female at birth, and is being seen today for some increased anxiety and nervousness over past week with a sensation of feeling "on edge". Thinks related to the loss of her daughter as she has been thinking about her more over the holidays and in the new year. Her daughter passed from cancer last April at 58 years old. Notes some depressed mood, irritability, change in appetite. Sleep is always hit or miss due to history of insomnia (takes Trazodone PRN) and OSA (recently started CPAP with some improvement in sleep). Denies SI/HI.   HPI: HPI  Problems:  Patient Active Problem List   Diagnosis Date Noted   Severe sleep apnea 03/14/2022   Loud snoring 01/02/2022   Family history of melanoma 03/05/2017   Surgical menopause 03/10/2015   Increased BMI 03/10/2015   History of anxiety 03/10/2015   Diverticulosis 03/10/2015   Hyperlipidemia 03/10/2015    Allergies:  Allergies  Allergen Reactions   Penicillins    Sulfa Antibiotics    Medications:  Current Outpatient Medications:    hydrOXYzine (VISTARIL) 25 MG capsule, Take 1 capsule (25 mg total) by mouth every 8 (eight) hours as needed for anxiety., Disp:  30 capsule, Rfl: 0   Multiple Vitamin (MULTIVITAMIN) capsule, Take 1 capsule by mouth daily., Disp: , Rfl:    traZODone (DESYREL) 50 MG tablet, Take 0.5-1 tablets (25-50 mg total) by mouth at bedtime as needed for sleep., Disp: 30 tablet, Rfl: 3  Observations/Objective: Patient is well-developed, well-nourished in no acute distress.  Resting comfortably at home.  Head is normocephalic, atraumatic.  No labored breathing. Speech is clear  and coherent with logical content.  Patient is alert and oriented at baseline.   Assessment and Plan: 1. Anxiety state - hydrOXYzine (VISTARIL) 25 MG capsule; Take 1 capsule (25 mg total) by mouth every 8 (eight) hours as needed for anxiety.  Dispense: 30 capsule; Refill: 0  Some acute on more generalized anxiety as a component of adjustment disorder due to loss this past year. Some depressed mood without SI/HI. Counseling resources given. Wills tart trial of Hydroxyzine. She is to call and schedule PCP follow-up as SSRI/SNRI may be needed.   Follow Up Instructions: I discussed the assessment and treatment plan with the patient. The patient was provided an opportunity to ask questions and all were answered. The patient agreed with the plan and demonstrated an understanding of the instructions.  A copy of instructions were sent to the patient via MyChart unless otherwise noted below.   The patient was advised to call back or seek an in-person evaluation if the symptoms worsen or if the condition fails to improve as anticipated.  Time:  I spent 10 minutes with the patient via telehealth technology discussing the above problems/concerns.    Leeanne Rio, PA-C

## 2022-05-14 ENCOUNTER — Telehealth: Payer: BC Managed Care – PPO | Admitting: Primary Care

## 2022-05-14 ENCOUNTER — Encounter: Payer: Self-pay | Admitting: Primary Care

## 2022-05-14 DIAGNOSIS — G4730 Sleep apnea, unspecified: Secondary | ICD-10-CM

## 2022-05-14 NOTE — Progress Notes (Signed)
Virtual Visit via Video Note  I connected with Joanna Fields on 05/14/22 at  3:30 PM EST by a video enabled telemedicine application and verified that I am speaking with the correct person using two identifiers.  Location: Patient: Home  Provider: Office    I discussed the limitations of evaluation and management by telemedicine and the availability of in person appointments. The patient expressed understanding and agreed to proceed.  History of Present Illness: 57 year old female, former smoker.  Past medical history significant for hyperlipidemia, anxiety, diverticulosis, obesity.  Previous [facility] pulmonary encounter: 01/01/2022 Patient presents today for sleep study. Patient has symptoms of loud snoring and restless sleep. She wakes herself up several times during the night. She is not sleeping well and does not feel rested when she wakes up in the morning. She recently lost her daughter in April. She takes Trazodone for insomnia symptoms. Typical bedtime is between 8 and 9:30 PM.  It takes her on average 15 to 30 minutes to fall asleep. She wakes up 2-3 times at night.  She starts her day between 630 and 7 AM.  Her weight is up 20 pounds in the last 2 years.  No previous sleep studies.  She does not currently wear CPAP or oxygen.  Epworth score is 5.  No symptoms of narcolepsy, cataplexy or sleepwalking.  Sleep questionnaire Symptoms-  Snoring, restless and non-restorative sleep Prior sleep study- None Bedtime- 8-9:30pm Time to fall asleep- 15-30 min  Nocturnal awakenings- 2-3 times Out of bed/start of day- 6:30-7am Weight changes- 20 lbs  Do you operate heavy machinery- No Do you currently wear CPAP- No Do you current wear oxygen- No Epworth- 5  03/14/2022 Patient presents today to review sleep study results.  Patient has symptoms of snoring, restless and nonrestorative sleep. HST 02/27/22 showed severe OSA, AHI 68/hour with SpO2 low 74% (baseline 93%).  We reviewed sleep study results  and treatment options.  Recommending patient be started on auto CPAP due to severity of her OSA and hypoxemia.  Patient is agreement with plan.   05/14/2022 - Interim hx  Patient contacted today for virtual video visit/ CPAP compliance.  Patient had a home sleep study in November 2023 that showed severe obstructive sleep apnea, AHI 68/hour.  She was started on CPAP back in November/December 2023.  She is 100% compliant with use, average usage 6 hours 59 minutes.  She is tolerating CPAP well.  She reports benefit in use.  She is sleeping through the night better and has more energy.  Snoring has improved per husband.  She uses a nasal mask.  DME company is [employer]  Review download 04/11/2022 - 05/10/2022 Usage 30/30 days; 93% greater than 4 hours Average usage 6 hours 59 minutes Pressure 5 to 20 cm H2O (11 cm H2O-95%) Air leaks 6.6 L/min AHI 2.0    Observations/Objective:  - Appears well; No overt shortness of breath or respiratory symptoms   Assessment and Plan:  Severe sleep apnea - HST on 11/723 >> AHI 68/hour  - Started on CPAP in November/December 2023 - Patient is 93% compliant with CPAP use > 4 hours last 30 days. She reports benefit from use - Pressure setting 5-20cm h20; Residual AHI 2.0/hour - Advised patient continue wear CPAP nightly, focus on side sleeping position and encourage weight loss.  Advised against driving if experiencing excessive daytime sleepiness. - No changes today. DME [employer].   Insomnia: - Continue Trazodone 25-50mg  at bedtime as needed for insomnia  Follow Up Instructions:  - Follow-up in 6 months or sooner if needed   I discussed the assessment and treatment plan with the patient. The patient was provided an opportunity to ask questions and all were answered. The patient agreed with the plan and demonstrated an understanding of the instructions.   The patient was advised to call back or seek an in-person evaluation if the symptoms worsen or if the  condition fails to improve as anticipated.  I provided 28 minutes of non-face-to-face time during this encounter.   Martyn Ehrich, NP

## 2022-05-18 NOTE — Progress Notes (Signed)
Reviewed and agree with assessment/plan.   Jessicah Croll, MD [facility] 05/18/2022, 7:19 AM Pager:  [phone]  

## 2022-05-25 DIAGNOSIS — G4733 Obstructive sleep apnea (adult) (pediatric): Secondary | ICD-10-CM | POA: Diagnosis not present

## 2022-06-07 DIAGNOSIS — G4733 Obstructive sleep apnea (adult) (pediatric): Secondary | ICD-10-CM | POA: Diagnosis not present

## 2022-06-23 DIAGNOSIS — G4733 Obstructive sleep apnea (adult) (pediatric): Secondary | ICD-10-CM | POA: Diagnosis not present

## 2022-07-05 DIAGNOSIS — J069 Acute upper respiratory infection, unspecified: Secondary | ICD-10-CM | POA: Diagnosis not present

## 2022-07-06 DIAGNOSIS — G4733 Obstructive sleep apnea (adult) (pediatric): Secondary | ICD-10-CM | POA: Diagnosis not present

## 2022-07-07 DIAGNOSIS — G4733 Obstructive sleep apnea (adult) (pediatric): Secondary | ICD-10-CM | POA: Diagnosis not present

## 2022-07-07 DIAGNOSIS — J22 Unspecified acute lower respiratory infection: Secondary | ICD-10-CM | POA: Diagnosis not present

## 2022-07-24 ENCOUNTER — Encounter: Payer: Self-pay | Admitting: Internal Medicine

## 2022-07-25 NOTE — Progress Notes (Unsigned)
Established Patient Office Visit  Subjective    Patient ID: Joanna Fields, female    DOB: [birth year]  Age: 57 y.o. MRN: [id number]  CC:  No chief complaint on file.   HPI Joanna Fields presents to discuss weight loss medications.    MDD: -Mood status: uncontrolled - daughter passed away about 1 year ago -Current treatment: Nothing currently  Psychotherapy/counseling: yes current; weekly has done 3 sessions  Anxious mood: sometimes Anhedonia: yes Significant weight loss or gain: yes Insomnia: yes hard to stay asleep Fatigue: yes     12/19/2021   11:15 AM 08/25/2020    4:14 PM  Depression screen PHQ 2/9  Decreased Interest 0 0  Down, Depressed, Hopeless 2 0  PHQ - 2 Score 2 0  Altered sleeping 3   Tired, decreased energy 3   Change in appetite 2   Feeling bad or failure about yourself  2   Trouble concentrating 1   Moving slowly or fidgety/restless 0   Suicidal thoughts 0   PHQ-9 Score 13   Difficult doing work/chores Somewhat difficult    Snoring:  -Husband states that the patient is snoring consisently -Denies witnessed apneic events -Wakes up feeling tired, not refreshed. Denies morning headaches but is sleepy throughout the day  Weight Gain: -Had been on Phentermine previously but had heart palpitations and stopped it  -Interested in new weight loss medications, is pre-diabetic,last A1c 8/23 5.9%  Health Maintenance: -Blood work due -Mammogram 8/23 Birads-1 -Colonoscopy - obtaining records; goes every 5 years   Outpatient Encounter Medications as of 07/26/2022  Medication Sig   hydrOXYzine (VISTARIL) 25 MG capsule Take 1 capsule (25 mg total) by mouth every 8 (eight) hours as needed for anxiety.   Multiple Vitamin (MULTIVITAMIN) capsule Take 1 capsule by mouth daily.   traZODone (DESYREL) 50 MG tablet Take 0.5-1 tablets (25-50 mg total) by mouth at bedtime as needed for sleep.   No facility-administered encounter medications on file as of 07/26/2022.     Past Medical History:  Diagnosis Date   Anxiety 08/20/21   Day my Daughter passed   Colitis    Depression 11/30/21   My Daughter passed 08/20/21   Diverticulitis    Dysuria    Hematuria    Hyperlipemia    Increased BMI    Obesity    Sleep apnea Want to be tested   Surgical menopause     Past Surgical History:  Procedure Laterality Date   BREAST CYST ASPIRATION Left 06/24/2020   CHOLECYSTECTOMY  1991   COLON SURGERY     COSMETIC SURGERY     on face- dog bite   DILATION AND CURETTAGE OF UTERUS     HERNIA REPAIR  2004   ventral   HYSTEROSCOPY     HYSTEROSCOPY WITH D & C     OVARIAN CYST REMOVAL Left 2005   SMALL INTESTINE SURGERY     TUBAL LIGATION     VAGINAL HYSTERECTOMY  02/2013   tvh bso    Family History  Problem Relation Age of Onset   Asthma Mother    COPD Mother    Kidney disease Mother    Obesity Mother    Diabetes Father    Hearing loss Father    Melanoma Daughter    Cancer Daughter    Breast cancer Maternal Aunt    Ovarian cancer Neg Hx    Colon cancer Neg Hx     Social History   Socioeconomic History  Marital status: Married    Spouse name: Not on file   Number of children: Not on file   Years of education: Not on file   Highest education level: Some college, no degree  Occupational History   Not on file  Tobacco Use   Smoking status: Former    Packs/day: .25    Types: Cigarettes    Quit date: 59    Years since quitting: 39.2    Passive exposure: Never   Smokeless tobacco: Never  Vaping Use   Vaping Use: Never used  Substance and Sexual Activity   Alcohol use: No   Drug use: No   Sexual activity: Yes    Birth control/protection: Surgical  Other Topics Concern   Not on file  Social History Narrative   Not on file   Social Determinants of Health   Financial Resource Strain: Low Risk  (07/24/2022)   Overall Financial Resource Strain (CARDIA)    Difficulty of Paying Living Expenses: Not hard at all  Food Insecurity: No  Food Insecurity (07/24/2022)   Hunger Vital Sign    Worried About Running Out of Food in the Last Year: Never true    [place] in the Last Year: Never true  Transportation Needs: No Transportation Needs (07/24/2022)   PRAPARE - Hydrologist (Medical): No    Lack of Transportation (Non-Medical): No  Physical Activity: Unknown (07/24/2022)   Exercise Vital Sign    Days of Exercise per Week: 0 days    Minutes of Exercise per Session: Not on file  Stress: No Stress Concern Present (07/24/2022)   [place]    Feeling of Stress : Not at all  Social Connections: Unknown (07/24/2022)   Social Connection and Isolation Panel [NHANES]    Frequency of Communication with Friends and Family: More than three times a week    Frequency of Social Gatherings with Friends and Family: Three times a week    Attends Religious Services: Not on file    Active Member of Clubs or Organizations: Yes    Attends Club or Organization Meetings: More than 4 times per year    Marital Status: Married  Intimate Partner Violence: Not At Risk (03/12/2018)   Humiliation, Afraid, Rape, and Kick questionnaire    Fear of Current or Ex-Partner: No    Emotionally Abused: No    Physically Abused: No    Sexually Abused: No    Review of Systems  Constitutional:  Positive for malaise/fatigue. Negative for chills and fever.  Respiratory:  Negative for shortness of breath.   Cardiovascular:  Negative for chest pain.  Gastrointestinal:  Negative for abdominal pain, nausea and vomiting.  Psychiatric/Behavioral:  The patient has insomnia.         Objective    There were no vitals taken for this visit.  Physical Exam Constitutional:      Appearance: Normal appearance. She is obese.  HENT:     Head: Normocephalic and atraumatic.     Mouth/Throat:     Mouth: Mucous membranes are moist.     Pharynx: Oropharynx is clear.  Eyes:      Extraocular Movements: Extraocular movements intact.     Conjunctiva/sclera: Conjunctivae normal.     Pupils: Pupils are equal, round, and reactive to light.  Cardiovascular:     Rate and Rhythm: Normal rate and regular rhythm.  Pulmonary:     Effort: Pulmonary effort is  normal.     Breath sounds: Normal breath sounds.  Musculoskeletal:     Right lower leg: No edema.     Left lower leg: No edema.  Skin:    General: Skin is warm and dry.  Neurological:     General: No focal deficit present.     Mental Status: She is alert. Mental status is at baseline.  Psychiatric:        Mood and Affect: Mood normal.        Behavior: Behavior normal.         Assessment & Plan:   1. Psychophysiological insomnia/Anxiety/Grief: Doing grief counseling currently, which is beneficial. Having a hard time falling asleep if she wakes up in the night due to grief/anxiety. Will start Trazodone 50 mg before bedtime to help with sleep and anxiety. Follow up in 3 months to recheck.  - traZODone (DESYREL) 50 MG tablet; Take 0.5-1 tablets (25-50 mg total) by mouth at bedtime as needed for sleep.  Dispense: 30 tablet; Refill: 3 - Ambulatory referral to Pulmonology  2. Snoring: Snoring, morning fatigue symptoms as well. Patient will be referred to sleep medicine for sleep study.   - Ambulatory referral to Pulmonology  3. Obesity (BMI 30-39.9)/Weight gain: Discussed GLP1's for weight loss. Will obtain labs and A1c today. Discussed mechanism of action and side effects of medication. Discussed risk for unknown long term side effects of medication. Discussed how lifestyle modification is required for weight loss while on the medication. Patient will call her insurance to see coverage options and we will go from there.   - CBC w/Diff/Platelet - COMPLETE METABOLIC PANEL WITH GFR - HgB A1c  4. Lipid screening/Need for hepatitis C screening test/Encounter for screening for HIV/Vitamin D deficiency: Screening  labs today as well.   - Lipid Profile - Hepatitis C Antibody - HIV antibody (with reflex) - Vitamin D (25 hydroxy)   No follow-ups on file.   Teodora Medici, DO

## 2022-07-26 ENCOUNTER — Ambulatory Visit: Payer: BC Managed Care – PPO | Admitting: Internal Medicine

## 2022-07-26 ENCOUNTER — Encounter: Payer: Self-pay | Admitting: Internal Medicine

## 2022-07-26 VITALS — BP 128/78 | HR 88 | Temp 97.4°F | Resp 18 | Ht 69.0 in | Wt 251.0 lb

## 2022-07-26 DIAGNOSIS — E669 Obesity, unspecified: Secondary | ICD-10-CM | POA: Diagnosis not present

## 2022-07-26 DIAGNOSIS — F419 Anxiety disorder, unspecified: Secondary | ICD-10-CM

## 2022-07-26 DIAGNOSIS — R7303 Prediabetes: Secondary | ICD-10-CM | POA: Diagnosis not present

## 2022-07-26 MED ORDER — WEGOVY 0.25 MG/0.5ML ~~LOC~~ SOAJ: 0.2500 mg | SUBCUTANEOUS | 0 refills | Status: DC

## 2022-07-31 ENCOUNTER — Other Ambulatory Visit: Payer: Self-pay

## 2022-08-07 DIAGNOSIS — G4733 Obstructive sleep apnea (adult) (pediatric): Secondary | ICD-10-CM | POA: Diagnosis not present

## 2022-08-31 ENCOUNTER — Encounter: Payer: Self-pay | Admitting: Internal Medicine

## 2022-09-02 ENCOUNTER — Other Ambulatory Visit: Payer: Self-pay | Admitting: Internal Medicine

## 2022-09-02 DIAGNOSIS — R7303 Prediabetes: Secondary | ICD-10-CM

## 2022-09-02 DIAGNOSIS — E669 Obesity, unspecified: Secondary | ICD-10-CM

## 2022-09-02 MED ORDER — ZEPBOUND 2.5 MG/0.5ML ~~LOC~~ SOAJ: 2.5000 mg | SUBCUTANEOUS | 0 refills | Status: DC

## 2022-10-16 ENCOUNTER — Encounter: Payer: Self-pay | Admitting: Internal Medicine

## 2022-11-28 NOTE — Progress Notes (Unsigned)
Established Patient Office Visit  Subjective    Patient ID: Joanna Fields, female    DOB: [birth year]  Age: 57 y.o. MRN: [id number]  CC:  No chief complaint on file.   HPI Joanna Fields presents to discuss weight loss medications.   Obesity: -Had been on Phentermine previously but had heart palpitations and stopped it  -Interested in new weight loss medications, is pre-diabetic, last A1c 8/23 5.9% -Has tried calorie restriction and exercise without success  MDD: -Mood status: stable -Current treatment: Nothing currently  Psychotherapy/counseling: yes current; weekly has done 3 sessions  Anxious mood: sometimes Anhedonia: yes Significant weight loss or gain: yes Insomnia: yes hard to stay asleep Fatigue: yes     07/26/2022    3:06 PM 12/19/2021   11:15 AM 08/25/2020    4:14 PM  Depression screen PHQ 2/9  Decreased Interest 0 0 0  Down, Depressed, Hopeless 0 2 0  PHQ - 2 Score 0 2 0  Altered sleeping 0 3   Tired, decreased energy 0 3   Change in appetite 0 2   Feeling bad or failure about yourself  0 2   Trouble concentrating 0 1   Moving slowly or fidgety/restless 0 0   Suicidal thoughts 0 0   PHQ-9 Score 0 13   Difficult doing work/chores Not difficult at all Somewhat difficult    Health Maintenance: -Blood work UTD -Mammogram 8/23 Birads-1 -Colonoscopy - obtaining records; goes every 5 years   Outpatient Encounter Medications as of 11/29/2022  Medication Sig   Multiple Vitamin (MULTIVITAMIN) capsule Take 1 capsule by mouth daily.   tirzepatide (ZEPBOUND) 2.5 MG/0.5ML Pen Inject 2.5 mg into the skin once a week.   No facility-administered encounter medications on file as of 11/29/2022.    Past Medical History:  Diagnosis Date   Anxiety 08/20/21   Day my Daughter passed   Colitis    Depression 11/30/21   My Daughter passed 08/20/21   Diverticulitis    Dysuria    Hematuria    Hyperlipemia    Increased BMI    Obesity    Sleep apnea Want to be tested    Surgical menopause     Past Surgical History:  Procedure Laterality Date   BREAST CYST ASPIRATION Left 06/24/2020   CHOLECYSTECTOMY  1991   COLON SURGERY     COSMETIC SURGERY     on face- dog bite   DILATION AND CURETTAGE OF UTERUS     HERNIA REPAIR  2004   ventral   HYSTEROSCOPY     HYSTEROSCOPY WITH D & C     OVARIAN CYST REMOVAL Left 2005   SMALL INTESTINE SURGERY     TUBAL LIGATION     VAGINAL HYSTERECTOMY  02/2013   tvh bso    Family History  Problem Relation Age of Onset   Asthma Mother    COPD Mother    Kidney disease Mother    Obesity Mother    Diabetes Father    Hearing loss Father    Melanoma Daughter    Cancer Daughter    Breast cancer Maternal Aunt    Ovarian cancer Neg Hx    Colon cancer Neg Hx     Social History   Socioeconomic History   Marital status: Married    Spouse name: Not on file   Number of children: Not on file   Years of education: Not on file   Highest education level: Some college, no degree  Occupational History   Not on file  Tobacco Use   Smoking status: Former    Current packs/day: 0.00    Types: Cigarettes    Quit date: 1985    Years since quitting: 39.6    Passive exposure: Never   Smokeless tobacco: Never  Vaping Use   Vaping status: Never Used  Substance and Sexual Activity   Alcohol use: No   Drug use: No   Sexual activity: Yes    Birth control/protection: Surgical  Other Topics Concern   Not on file  Social History Narrative   Not on file   Social Determinants of Health   Financial Resource Strain: Low Risk  (07/24/2022)   Overall Financial Resource Strain (CARDIA)    Difficulty of Paying Living Expenses: Not hard at all  Food Insecurity: No Food Insecurity (07/24/2022)   Hunger Vital Sign    Worried About Running Out of Food in the Last Year: Never true    Ran Out of Food in the Last Year: Never true  Transportation Needs: No Transportation Needs (07/24/2022)   PRAPARE - Scientist, research (physical sciences) (Medical): No    Lack of Transportation (Non-Medical): No  Physical Activity: Unknown (07/24/2022)   Exercise Vital Sign    Days of Exercise per Week: 0 days    Minutes of Exercise per Session: Not on file  Stress: No Stress Concern Present (07/24/2022)   [employer] of Occupational Health - Occupational Stress Questionnaire    Feeling of Stress : Not at all  Social Connections: Unknown (07/24/2022)   Social Connection and Isolation Panel [NHANES]    Frequency of Communication with Friends and Family: More than three times a week    Frequency of Social Gatherings with Friends and Family: Three times a week    Attends Religious Services: Not on file    Active Member of Clubs or Organizations: Yes    Attends Club or Organization Meetings: More than 4 times per year    Marital Status: Married  Intimate Partner Violence: Not At Risk (03/12/2018)   Humiliation, Afraid, Rape, and Kick questionnaire    Fear of Current or Ex-Partner: No    Emotionally Abused: No    Physically Abused: No    Sexually Abused: No    Review of Systems  Constitutional:  Negative for chills and fever.  Respiratory:  Negative for shortness of breath.   Cardiovascular:  Negative for chest pain.  Gastrointestinal:  Negative for abdominal pain, nausea and vomiting.        Objective    There were no vitals taken for this visit.  Physical Exam Constitutional:      Appearance: Normal appearance. She is obese.  HENT:     Head: Normocephalic and atraumatic.  Eyes:     Conjunctiva/sclera: Conjunctivae normal.  Cardiovascular:     Rate and Rhythm: Normal rate and regular rhythm.  Pulmonary:     Effort: Pulmonary effort is normal.     Breath sounds: Normal breath sounds.  Musculoskeletal:     Right lower leg: No edema.     Left lower leg: No edema.  Skin:    General: Skin is warm and dry.  Neurological:     General: No focal deficit present.     Mental Status: She is alert. Mental status  is at baseline.  Psychiatric:        Mood and Affect: Mood normal.        Behavior: Behavior normal.  Assessment & Plan:   1. Obesity (BMI 35.0-39.9 without comorbidity)/Prediabetes: A1c pre-diabetic at 5.9%, start Wegovy 0.25 mg weekly. Discussed potential side effects and reasons for medication to be discontinued. Follow up here in 1 month for recheck after starting medication. Follow up here in August for physical as well.   - Semaglutide-Weight Management (WEGOVY) 0.25 MG/0.5ML SOAJ; Inject 0.25 mg into the skin once a week.  Dispense: 2 mL; Refill: 0  2. Anxiety: Stable, not on any medications currently. Continue to monitor.    No follow-ups on file.   Margarita Mail, DO

## 2022-11-29 ENCOUNTER — Encounter: Payer: Self-pay | Admitting: Internal Medicine

## 2022-11-29 ENCOUNTER — Telehealth (INDEPENDENT_AMBULATORY_CARE_PROVIDER_SITE_OTHER): Payer: PRIVATE HEALTH INSURANCE | Admitting: Internal Medicine

## 2022-11-29 DIAGNOSIS — Z1231 Encounter for screening mammogram for malignant neoplasm of breast: Secondary | ICD-10-CM

## 2022-11-29 DIAGNOSIS — R600 Localized edema: Secondary | ICD-10-CM | POA: Diagnosis not present

## 2022-11-29 DIAGNOSIS — Z13220 Encounter for screening for lipoid disorders: Secondary | ICD-10-CM

## 2022-11-29 DIAGNOSIS — Z7985 Long-term (current) use of injectable non-insulin antidiabetic drugs: Secondary | ICD-10-CM | POA: Diagnosis not present

## 2022-11-29 DIAGNOSIS — R7303 Prediabetes: Secondary | ICD-10-CM | POA: Diagnosis not present

## 2022-11-29 DIAGNOSIS — E1165 Type 2 diabetes mellitus with hyperglycemia: Secondary | ICD-10-CM

## 2022-11-29 DIAGNOSIS — E669 Obesity, unspecified: Secondary | ICD-10-CM | POA: Diagnosis not present

## 2022-11-29 MED ORDER — ZEPBOUND 2.5 MG/0.5ML ~~LOC~~ SOAJ: 2.5000 mg | SUBCUTANEOUS | 0 refills | Status: DC

## 2022-11-29 NOTE — Progress Notes (Signed)
Virtual Visit via Video Note  I connected with Joanna Fields on 11/29/22 at  2:40 PM EDT by a video enabled telemedicine application and verified that I am speaking with the correct person using two identifiers.  Location: Patient: Home Provider: Home due to weather   I discussed the limitations of evaluation and management by telemedicine and the availability of in person appointments. The patient expressed understanding and agreed to proceed.  History of Present Illness:  Joanna Fields presents via telemedicine to discuss weight loss medications and follow up.  She is also having some bilateral leg, leg and hand edema.  She states this is been worse over the last couple weeks.  She does sit most of the day while at work.  She also notes eating more sodium in her diet lately.  Obesity: -Had been on Phentermine previously but had heart palpitations and stopped it  -Interested in new weight loss medications, is pre-diabetic, last A1c 8/23 5.9% -Has tried calorie restriction and exercise without success  MDD: -Mood status: stable -Current treatment: Nothing currently  Psychotherapy/counseling: yes current; weekly has done 3 sessions  Anxious mood: sometimes Anhedonia: yes Significant weight loss or gain: yes Insomnia: yes hard to stay asleep Fatigue: yes     11/29/2022   11:19 AM 07/26/2022    3:06 PM 12/19/2021   11:15 AM 08/25/2020    4:14 PM  Depression screen PHQ 2/9  Decreased Interest 0 0 0 0  Down, Depressed, Hopeless 0 0 2 0  PHQ - 2 Score 0 0 2 0  Altered sleeping 0 0 3   Tired, decreased energy 0 0 3   Change in appetite 0 0 2   Feeling bad or failure about yourself  0 0 2   Trouble concentrating 0 0 1   Moving slowly or fidgety/restless 0 0 0   Suicidal thoughts 0 0 0   PHQ-9 Score 0 0 13   Difficult doing work/chores Not difficult at all Not difficult at all Somewhat difficult    Health Maintenance: -Blood work UTD -Mammogram 8/23 Birads-1 -Colonoscopy -  obtaining records; goes every 5 years    Observations/Objective:  General: well appearing, no acute distress Neuro: Answers all questions appropriately  Assessment and Plan:  1. Obesity (BMI 35.0-39.9 without comorbidity)/Prediabetes: Patient recently switched insurances, will try again to get the Zepbound 2.5 mg covered.  Will obtain labs, recheck A1c and cholesterol.  Discussed potential side effects of medication.  Patient will call to schedule an appointment for 4 weeks after she starts taking the medication.  - CBC w/Diff/Platelet - COMPLETE METABOLIC PANEL WITH GFR - tirzepatide (ZEPBOUND) 2.5 MG/0.5ML Pen; Inject 2.5 mg into the skin once a week.  Dispense: 2 mL; Refill: 0 - HgB A1c - tirzepatide (ZEPBOUND) 2.5 MG/0.5ML Pen; Inject 2.5 mg into the skin once a week.  Dispense: 2 mL; Refill: 0  2. Lipid screening: Will come to clinic fasting for labs drawn.  - Lipid Profile  3. Bilateral lower extremity edema: Difficult to evaluate over virtual but appears to be gravity dependent edema.  Discussed keeping her legs elevated, using compression stockings, decreasing sodium in the diet.  Will recheck at follow-up.  4. Encounter for screening mammogram for malignant neoplasm of breast: Mammogram ordered for this year.  - MM 3D SCREENING MAMMOGRAM BILATERAL BREAST; Future   Follow Up Instructions: 4 weeks after starting medication    I discussed the assessment and treatment plan with the patient. The patient was provided  an opportunity to ask questions and all were answered. The patient agreed with the plan and demonstrated an understanding of the instructions.   The patient was advised to call back or seek an in-person evaluation if the symptoms worsen or if the condition fails to improve as anticipated.  I provided 12 minutes of non-face-to-face time during this encounter.   Joanna Mail, DO

## 2022-12-11 ENCOUNTER — Ambulatory Visit
Admission: RE | Admit: 2022-12-11 | Discharge: 2022-12-11 | Disposition: A | Discharge status: Home / Self Care | Payer: PRIVATE HEALTH INSURANCE | Source: Ambulatory Visit | Attending: Internal Medicine | Admitting: Internal Medicine

## 2022-12-11 DIAGNOSIS — Z1231 Encounter for screening mammogram for malignant neoplasm of breast: Secondary | ICD-10-CM | POA: Diagnosis present

## 2022-12-12 LAB — COMPLETE METABOLIC PANEL WITH GFR: Glucose, Bld: 120 mg/dL — ABNORMAL HIGH (ref 65–99)

## 2022-12-12 LAB — CBC WITH DIFFERENTIAL/PLATELET
MPV: 10.2 fL (ref 7.5–12.5)
RDW: 12.5 % (ref 11.0–15.0)

## 2022-12-12 LAB — HEMOGLOBIN A1C: Mean Plasma Glucose: 143 mg/dL

## 2022-12-12 MED ORDER — TIRZEPATIDE 2.5 MG/0.5ML ~~LOC~~ SOAJ
2.5000 mg | SUBCUTANEOUS | 0 refills | Status: DC
Start: 2022-12-12 — End: 2023-02-14

## 2022-12-12 NOTE — Addendum Note (Signed)
Addended by: Margarita Mail on: 12/12/2022 08:06 AM   Modules accepted: Orders

## 2022-12-18 ENCOUNTER — Telehealth (INDEPENDENT_AMBULATORY_CARE_PROVIDER_SITE_OTHER): Payer: PRIVATE HEALTH INSURANCE | Admitting: Primary Care

## 2022-12-18 DIAGNOSIS — G4733 Obstructive sleep apnea (adult) (pediatric): Secondary | ICD-10-CM

## 2022-12-18 DIAGNOSIS — G4730 Sleep apnea, unspecified: Secondary | ICD-10-CM

## 2022-12-18 DIAGNOSIS — Z9989 Dependence on other enabling machines and devices: Secondary | ICD-10-CM | POA: Diagnosis not present

## 2022-12-18 NOTE — Assessment & Plan Note (Signed)
-   HST 02/27/22>> severe OSA, AHI 68/hour - She is 80% compliant with CPAP use >4 hours in the last 30 days and reports benefit from wearing - Pressure settings auto 5-20cm h20; AHI 1.6/hour  - No changes  - Advised patient to continue wear CPAP nightly 4-6 hours, work on weight loss as able, focus on side sleeping position and avoid driving if experiencing daytime sleepiness - FU in 1 year or sooner if needed

## 2022-12-18 NOTE — Patient Instructions (Signed)
Continue to wear CPAP  Follow-up in 1 year

## 2022-12-18 NOTE — Progress Notes (Signed)
Virtual Visit via Video Note  I connected with Joanna Fields on 12/18/22 at  3:30 PM EDT by a video enabled telemedicine application and verified that I am speaking with the correct person using two identifiers.  Location: Patient: Home Provider: Office    I discussed the limitations of evaluation and management by telemedicine and the availability of in person appointments. The patient expressed understanding and agreed to proceed.  History of Present Illness: 57 year old female, former smoker.  Past medical history significant for hyperlipidemia, anxiety, diverticulosis, obesity, OSA.  Previous [facility] pulmonary encounter: 01/01/2022 Patient presents today for sleep study. Patient has symptoms of loud snoring and restless sleep. She wakes herself up several times during the night. She is not sleeping well and does not feel rested when she wakes up in the morning. She recently lost her daughter in April. She takes Trazodone for insomnia symptoms. Typical bedtime is between 8 and 9:30 PM.  It takes her on average 15 to 30 minutes to fall asleep. She wakes up 2-3 times at night.  She starts her day between 630 and 7 AM.  Her weight is up 20 pounds in the last 2 years.  No previous sleep studies.  She does not currently wear CPAP or oxygen.  Epworth score is 5.  No symptoms of narcolepsy, cataplexy or sleepwalking.  Sleep questionnaire Symptoms-  Snoring, restless and non-restorative sleep Prior sleep study- None Bedtime- 8-9:30pm Time to fall asleep- 15-30 min  Nocturnal awakenings- 2-3 times Out of bed/start of day- 6:30-7am Weight changes- 20 lbs  Do you operate heavy machinery- No Do you currently wear CPAP- No Do you current wear oxygen- No Epworth- 5  03/14/2022 Patient presents today to review sleep study results.  Patient has symptoms of snoring, restless and nonrestorative sleep. HST 02/27/22 showed severe OSA, AHI 68/hour with SpO2 low 74% (baseline 93%).  We reviewed sleep study  results and treatment options.  Recommending patient be started on auto CPAP due to severity of her OSA and hypoxemia.  Patient is agreement with plan.   05/14/2022 - Interim hx  Patient contacted today for virtual video visit/ CPAP compliance.  Patient had a home sleep study in November 2023 that showed severe obstructive sleep apnea, AHI 68/hour.  She was started on CPAP back in November/December 2023.  She is 100% compliant with use, average usage 6 hours 59 minutes.  She is tolerating CPAP well.  She reports benefit in use.  She is sleeping through the night better and has more energy.  Snoring has improved per husband.  She uses a nasal mask.  DME company is [employer]  Review download 04/11/2022 - 05/10/2022 Usage 30/30 days; 93% greater than 4 hours Average usage 6 hours 59 minutes Pressure 5 to 20 cm H2O (11 cm H2O-95%) Air leaks 6.6 L/min AHI 2.0    Severe sleep apnea - HST on 11/723 >> AHI 68/hour  - Started on CPAP in November/December 2023 - Patient is 93% compliant with CPAP use > 4 hours last 30 days. She reports benefit from use - Pressure setting 5-20cm h20; Residual AHI 2.0/hour - Advised patient continue wear CPAP nightly, focus on side sleeping position and encourage weight loss.  Advised against driving if experiencing excessive daytime sleepiness. - No changes today. DME [employer].   12/18/2022- interim hx  Patient contacted today for virtual video visit. She has severe sleep apnea and is maintained on CPAP. She is doing well. Continues to be compliant with CPAP use.  She has no complaints. Tolerating CPAP mask and pressure settings. Continues to reports benefit from CPAP use.   [employer] download 11/17/2022 - 12/16/2022 Usage days 27/30 days (90%); 24 days (80%) greater than 4 hours Average usage 6 hours 30 minutes Pressure 5 to 20 cm H2O (11.6 cm H2O-95%) Air leaks 8.3 L/min (95%) AHI 1.6   Observations/Objective:  - Appears well; No overt shortness of breath/respiratory  symptoms  Assessment and Plan:  Severe OSA on CPAP: - HST 02/27/22>> severe OSA, AHI 68/hour - She is 80% compliant with CPAP use >4 hours in the last 30 days and reports benefit from wearing - Pressure settings auto 5-20cm h20; AHI 1.6/hour  - No changes  - Advised patient to continue wear CPAP nightly 4-6 hours, work on weight loss as able, focus on side sleeping position and avoid driving if experiencing daytime sleepiness - FU in 1 year or sooner if needed   Follow Up Instructions:  1 year with Waynetta Sandy NP    I discussed the assessment and treatment plan with the patient. The patient was provided an opportunity to ask questions and all were answered. The patient agreed with the plan and demonstrated an understanding of the instructions.   The patient was advised to call back or seek an in-person evaluation if the symptoms worsen or if the condition fails to improve as anticipated.  I provided 22 minutes of non-face-to-face time during this encounter.   Glenford Bayley, NP

## 2022-12-23 ENCOUNTER — Ambulatory Visit
Admission: EM | Admit: 2022-12-23 | Discharge: 2022-12-23 | Disposition: A | Discharge status: Home / Self Care | Payer: PRIVATE HEALTH INSURANCE | Attending: Emergency Medicine | Admitting: Emergency Medicine

## 2022-12-23 ENCOUNTER — Ambulatory Visit (INDEPENDENT_AMBULATORY_CARE_PROVIDER_SITE_OTHER): Payer: PRIVATE HEALTH INSURANCE

## 2022-12-23 ENCOUNTER — Encounter: Payer: Self-pay | Admitting: Emergency Medicine

## 2022-12-23 DIAGNOSIS — M25562 Pain in left knee: Secondary | ICD-10-CM

## 2022-12-23 MED ORDER — KETOROLAC TROMETHAMINE 30 MG/ML IJ SOLN
30.0000 mg | Freq: Once | INTRAMUSCULAR | Status: AC
  Administered 2022-12-23: 30 mg via INTRAMUSCULAR

## 2022-12-23 MED ORDER — PREDNISONE 20 MG PO TABS: 40.0000 mg | ORAL_TABLET | Freq: Every day | ORAL | 0 refills | Status: DC

## 2022-12-23 MED ORDER — CYCLOBENZAPRINE HCL 10 MG PO TABS: 10.0000 mg | ORAL_TABLET | Freq: Every day | ORAL | 0 refills | Status: DC

## 2022-12-23 MED ORDER — DEXAMETHASONE SODIUM PHOSPHATE 10 MG/ML IJ SOLN
10.0000 mg | Freq: Once | INTRAMUSCULAR | Status: AC
  Administered 2022-12-23: 10 mg via INTRAMUSCULAR

## 2022-12-23 NOTE — Discharge Instructions (Addendum)
Today you have been evaluated for your left knee pain  X-rays shows osteoarthritis  You have been given an injection of Toradol and Decadron here today in the office, both medicines help to reduce inflammation and will help with pain ideally should see improvement in 30 minutes to an hour  Starting tomorrow take prednisone every morning with food as directed to continue the above process, you may use Tylenol in addition to this as needed for management of pain  You may use Flexeril at bedtime to provide comfort to allow you to rest, this make you feel sleepy  You may use ice or heat over the affected area 10 to 15-minute intervals  You may use compression wrap to provide stability and support with movement and use crutches until you are able to put weight onto the leg  prop on pillows whenever sitting and lying to help reduce swelling  Massage and stretch as tolerated  If your symptoms continue to persist or worsen you may follow-up with his urgent care or orthopedic specialist whose information is on front page

## 2022-12-23 NOTE — ED Triage Notes (Signed)
Patient c/o pain at the back of her left knee for the past 2-3 days.  Patient states that she did feel a pop while she was walking.  Patient denies fall.

## 2022-12-23 NOTE — ED Provider Notes (Signed)
[facility]    CSN: [id number] Arrival date & time: 12/23/22  1442      History   Chief Complaint Chief Complaint  Patient presents with   Knee Pain    left    HPI Joanna Fields is a 57 y.o. female.   Patient presents for evaluation of posterior left knee pain for 2 to 3 days.  Symptoms worsening today.  Initially intermittently but have become constant.  Exacerbated by walking, felt as if knee was going to buckle and is now unable to bear weight to the left lower extremity.  Pain described as a tightness with intermittent numbness.  Has attempted use of Advil which has provided minimal relief.  Endorses a popping that was heard today.  Denies prior injury.    Past Medical History:  Diagnosis Date   Anxiety 08/20/21   Day my Daughter passed   Colitis    Depression 11/30/21   My Daughter passed 08/20/21   Diverticulitis    Dysuria    Hematuria    Hyperlipemia    Increased BMI    Obesity    Sleep apnea Want to be tested   Surgical menopause     Patient Active Problem List   Diagnosis Date Noted   Severe sleep apnea 03/14/2022   Loud snoring 01/02/2022   Family history of melanoma 03/05/2017   Surgical menopause 03/10/2015   Increased BMI 03/10/2015   History of anxiety 03/10/2015   Diverticulosis 03/10/2015   Hyperlipidemia 03/10/2015    Past Surgical History:  Procedure Laterality Date   BREAST CYST ASPIRATION Left 06/24/2020   CHOLECYSTECTOMY  1991   COLON SURGERY     COSMETIC SURGERY     on face- dog bite   DILATION AND CURETTAGE OF UTERUS     HERNIA REPAIR  2004   ventral   HYSTEROSCOPY     HYSTEROSCOPY WITH D & C     OVARIAN CYST REMOVAL Left 2005   SMALL INTESTINE SURGERY     TUBAL LIGATION     VAGINAL HYSTERECTOMY  02/2013   tvh bso    OB History     Gravida  2   Para  2   Term  2   Preterm      AB      Living  2      SAB      IAB      Ectopic      Multiple      Live Births  2            Home  Medications    Prior to Admission medications   Medication Sig Start Date End Date Taking? Authorizing Provider  Multiple Vitamin (MULTIVITAMIN) capsule Take 1 capsule by mouth daily.    [provider]  tirzepatide Greggory Keen) 2.5 MG/0.5ML Pen Inject 2.5 mg into the skin once a week. 12/12/22   Margarita Mail, DO    Family History Family History  Problem Relation Age of Onset   Asthma Mother    COPD Mother    Kidney disease Mother    Obesity Mother    Diabetes Father    Hearing loss Father    Melanoma Daughter    Cancer Daughter    Breast cancer Maternal Aunt    Ovarian cancer Neg Hx    Colon cancer Neg Hx     Social History Social History   Tobacco Use   Smoking status: Former    Current packs/day:  0.00    Types: Cigarettes    Quit date: 19    Years since quitting: 39.6    Passive exposure: Never   Smokeless tobacco: Never  Vaping Use   Vaping status: Never Used  Substance Use Topics   Alcohol use: No   Drug use: No     Allergies   Penicillins and Sulfa antibiotics   Review of Systems Review of Systems  Constitutional: Negative.   Respiratory: Negative.    Cardiovascular: Negative.   Musculoskeletal:  Positive for myalgias. Negative for arthralgias, back pain, gait problem, joint swelling, neck pain and neck stiffness.  Skin: Negative.      Physical Exam Triage Vital Signs ED Triage Vitals  Encounter Vitals Group     BP 12/23/22 1451 137/82     Systolic BP Percentile --      Diastolic BP Percentile --      Pulse Rate 12/23/22 1451 88     Resp 12/23/22 1451 14     Temp 12/23/22 1451 98.6 F (37 C)     Temp Source 12/23/22 1451 Oral     SpO2 12/23/22 1451 95 %     Weight 12/23/22 1450 251 lb 1.7 oz (113.9 kg)     Height 12/23/22 1450 5\' 9"  (1.753 m)     Head Circumference --      Peak Flow --      Pain Score 12/23/22 1450 7     Pain Loc --      Pain Education --      Exclude from Growth Chart --    No data found.  Updated  Vital Signs BP 137/82 (BP Location: Right Arm)   Pulse 88   Temp 98.6 F (37 C) (Oral)   Resp 14   Ht 5\' 9"  (1.753 m)   Wt 251 lb 1.7 oz (113.9 kg)   SpO2 95%   BMI 37.08 kg/m   Visual Acuity Right Eye Distance:   Left Eye Distance:   Bilateral Distance:    Right Eye Near:   Left Eye Near:    Bilateral Near:     Physical Exam Constitutional:      Appearance: Normal appearance.  Eyes:     Extraocular Movements: Extraocular movements intact.  Pulmonary:     Effort: Pulmonary effort is normal.  Musculoskeletal:     Comments: Tenderness directly to the center of the posterior knee to the upper and the the lower leg following the tended line, no ecchymosis swelling or deformity, unable to bear weight due to pain elicited, able to flex and extend, 2+ popliteal pulse, no effusion noted  Neurological:     Mental Status: She is alert and oriented to person, place, and time. Mental status is at baseline.      UC Treatments / Results  Labs (all labs ordered are listed, but only abnormal results are displayed) Labs Reviewed - No data to display  EKG   Radiology No results found.  Procedures Procedures (including critical care time)  Medications Ordered in UC Medications - No data to display  Initial Impression / Assessment and Plan / UC Course  I have reviewed the triage vital signs and the nursing notes.  Pertinent labs & imaging results that were available during my care of the patient were reviewed by me and considered in my medical decision making (see chart for details).  Acute left knee pain  X-ray showing osteoarthritis, discussed with patient, given IM Toradol and Decadron, Ace bandage applied  by nursing staff to be used for stability and support as needed, given crutches as she is unable to bear weight at this time, recommended RICE, heat massage stretching and activity as tolerated, given walking referral to orthopedics if symptoms continue to persist or  worsen Final Clinical Impressions(s) / UC Diagnoses   Final diagnoses:  None   Discharge Instructions   None    ED Prescriptions   None    PDMP not reviewed this encounter.   Valinda Hoar, [place] 12/26/22 [phone]

## 2022-12-27 ENCOUNTER — Other Ambulatory Visit: Payer: Self-pay | Admitting: Internal Medicine

## 2022-12-27 DIAGNOSIS — E1165 Type 2 diabetes mellitus with hyperglycemia: Secondary | ICD-10-CM

## 2022-12-27 MED ORDER — METFORMIN HCL 500 MG PO TABS
500.0000 mg | ORAL_TABLET | Freq: Every day | ORAL | 0 refills | Status: DC
Start: 2022-12-27 — End: 2023-04-08

## 2023-02-13 ENCOUNTER — Encounter: Payer: Self-pay | Admitting: Internal Medicine

## 2023-02-14 ENCOUNTER — Other Ambulatory Visit: Payer: Self-pay | Admitting: Internal Medicine

## 2023-02-14 DIAGNOSIS — E1165 Type 2 diabetes mellitus with hyperglycemia: Secondary | ICD-10-CM

## 2023-02-14 DIAGNOSIS — E669 Obesity, unspecified: Secondary | ICD-10-CM

## 2023-02-14 MED ORDER — TIRZEPATIDE 2.5 MG/0.5ML ~~LOC~~ SOAJ
2.5000 mg | SUBCUTANEOUS | 1 refills | Status: DC
Start: 2023-02-14 — End: 2023-04-08

## 2023-04-08 ENCOUNTER — Encounter: Payer: Self-pay | Admitting: Internal Medicine

## 2023-04-08 ENCOUNTER — Telehealth (INDEPENDENT_AMBULATORY_CARE_PROVIDER_SITE_OTHER): Payer: PRIVATE HEALTH INSURANCE | Admitting: Physician Assistant

## 2023-04-08 DIAGNOSIS — E1165 Type 2 diabetes mellitus with hyperglycemia: Secondary | ICD-10-CM

## 2023-04-08 DIAGNOSIS — Z7985 Long-term (current) use of injectable non-insulin antidiabetic drugs: Secondary | ICD-10-CM

## 2023-04-08 DIAGNOSIS — E669 Obesity, unspecified: Secondary | ICD-10-CM | POA: Diagnosis not present

## 2023-04-08 DIAGNOSIS — J069 Acute upper respiratory infection, unspecified: Secondary | ICD-10-CM | POA: Diagnosis not present

## 2023-04-08 MED ORDER — TIRZEPATIDE 5 MG/0.5ML ~~LOC~~ SOAJ
5.0000 mg | SUBCUTANEOUS | 0 refills | Status: AC
Start: 2023-04-08 — End: ?

## 2023-04-08 MED ORDER — AZITHROMYCIN 250 MG PO TABS
ORAL_TABLET | ORAL | 0 refills | Status: AC
Start: 2023-04-08 — End: ?

## 2023-04-08 NOTE — Assessment & Plan Note (Signed)
Chronic, historic condition  Request for refill and increase of Mounjaro to 5.0 mg. She is not monitoring blood sugars at home but can start if needed.She is exercising regularly, and making dietary changes. Discussed no difference in pen mechanism with increased dosage. Informed about potential side effects and advised to report any concerns. - Prescribe Mounjaro 5.0 mg - Advise to monitor for side effects, including hypoglycemia - Schedule lab appointment for A1c and microalbumin testing within 1-2 weeks Follow up in 3 months or sooner if concerns arise

## 2023-04-08 NOTE — Progress Notes (Signed)
Virtual Visit via Video Note  I connected with Brock Ra Moltz on 04/08/23 at  1:00 PM EST by a video enabled telemedicine application and verified that I am speaking with the correct person using two identifiers.  Today's Provider: Jacquelin Hawking, MHS, PA-C Introduced myself to the patient as a PA-C and provided education on APPs in clinical practice.    Location: Patient: at home  Provider: [facility], [place], [place]    I discussed the limitations of evaluation and management by telemedicine and the availability of in person appointments. The patient expressed understanding and agreed to proceed.   Chief Complaint  Patient presents with   Medication Refill    Mounjaro, would like to go up on dose   Cough    x2 days, productive. OTC meds not helping    History of Present Illness:  Discussed the use of AI scribe software for clinical note transcription with the patient, who gave verbal consent to proceed.  History of Present Illness   The patient, presents with worsening upper respiratory symptoms. They report being in close contact with family members recently diagnosed with walking pneumonia. The patient's symptoms began last week and have progressively worsened, despite attempts at self-management with over-the-counter Mucinex and cough medicine. The symptoms have now extended to the nasal passages and are accompanied by an increasing cough. The patient also reports that their CPAP machine woke them up due to difficulty breathing.     Diabetes, Type 2 - Last A1c 6.6% - Medications: Mounjaro 2.5 mg weekly injection She reports she is doing well on Mounjaro- states she did not have any side effects  - Compliance: Good  - Checking BG at home: She does not check by has ability to do so  - Diet: she is trying to improve diet - cutting out fast foods and incorporating more vegetables - Exercise: She uses stationary bike every day for 15-20 minutes  - Eye exam:  reviewed importance of annual eye exam for retinopathy screening  - Foot exam: complete in person  - Microalbumin: ordered today  - Statin: not on statin  - PNA vaccine: NA - Denies symptoms of hypoglycemia, polyuria, polydipsia, numbness extremities, foot ulcers/trauma   The patient has been making lifestyle modifications, including regular exercise on a bike and dietary changes towards healthier options, in an attempt to better manage their diabetes.  The patient also requested a refill of their Mounjaro  medication and expressed a desire to increase the dosage to 5.0 mg in hopes of achieving better glycemic control.       Concern for URI  She reports her grandson was in hospital for walking pneumonia States her daughter in law also had similar symptoms as well   Started Thurs of last week- she is coughing more, has more sinus congestion  Interventions: mucinex, delsym cough medicine      Observations/Objective:  Due to the nature of the virtual visit, physical exam and observations are limited. Able to obtain the following observations:   Alert, oriented, x3 Appears comfortable, in no acute distress.  No scleral injection, no appreciated hoarseness, tachypnea, wheeze or strider. Able to maintain conversation without visible strain.  No cough appreciated during visit.    Assessment and Plan:  Problem List Items Addressed This Visit       Endocrine   Type 2 diabetes mellitus with hyperglycemia, without long-term current use of insulin (HCC) - Primary   Chronic, historic condition  Request for refill  and increase of Mounjaro to 5.0 mg. She is not monitoring blood sugars at home but can start if needed.She is exercising regularly, and making dietary changes. Discussed no difference in pen mechanism with increased dosage. Informed about potential side effects and advised to report any concerns. - Prescribe Mounjaro 5.0 mg - Advise to monitor for side effects, including  hypoglycemia - Schedule lab appointment for A1c and microalbumin testing within 1-2 weeks Follow up in 3 months or sooner if concerns arise        Relevant Medications   tirzepatide (MOUNJARO) 5 MG/0.5ML Pen   Other Relevant Orders   HgB A1c   Urine Microalbumin w/creat. ratio     Other   Obesity (BMI 35.0-39.9 without comorbidity)   Relevant Medications   tirzepatide (MOUNJARO) 5 MG/0.5ML Pen   Other Visit Diagnoses       Upper respiratory tract infection, unspecified type       Relevant Medications   azithromycin (ZITHROMAX) 250 MG tablet      Assessment and Plan    Upper Respiratory Infection Symptoms include nasal congestion and cough, worsening over the past few days. Exposure to family members with walking pneumonia. No history of asthma or COPD. Concern for progression to pneumonia. Current use of Mucinex and cough medicine without significant improvement. CPAP use disrupted due to breathing difficulties. - Continue Mucinex and cough medicine - Prescribe Z-Pak - Advise to seek medical attention if symptoms worsen (difficulty breathing, gasping, dizziness, hypotension)   Follow Up Instructions:    I discussed the assessment and treatment plan with the patient. The patient was provided an opportunity to ask questions and all were answered. The patient agreed with the plan and demonstrated an understanding of the instructions.   The patient was advised to call back or seek an in-person evaluation if the symptoms worsen or if the condition fails to improve as anticipated.  I provided 8 minutes of non-face-to-face time during this encounter.  No follow-ups on file.   I, Michaiah Holsopple E Adreonna Yontz, PA-C, have reviewed all documentation for this visit. The documentation on 04/08/23 for the exam, diagnosis, procedures, and orders are all accurate and complete.   Jacquelin Hawking, MHS, PA-C [facility]

## 2023-05-27 ENCOUNTER — Telehealth: Payer: PRIVATE HEALTH INSURANCE | Admitting: Physician Assistant

## 2023-05-27 DIAGNOSIS — R112 Nausea with vomiting, unspecified: Secondary | ICD-10-CM | POA: Diagnosis not present

## 2023-05-27 MED ORDER — ONDANSETRON 4 MG PO TBDP: 4.0000 mg | ORAL_TABLET | Freq: Three times a day (TID) | ORAL | 0 refills | Status: AC | PRN

## 2023-05-27 NOTE — Progress Notes (Signed)

## 2024-02-05 ENCOUNTER — Other Ambulatory Visit: Payer: Self-pay | Admitting: Emergency Medicine

## 2024-02-05 DIAGNOSIS — Z1231 Encounter for screening mammogram for malignant neoplasm of breast: Secondary | ICD-10-CM
